# Patient Record
Sex: Male | Born: 1980 | Race: Black or African American | Hispanic: No | Marital: Married | Smoking: Former smoker
Health system: Southern US, Community
[De-identification: ages and names within clinical notes are randomized; demographics above are authoritative.]

## PROBLEM LIST (undated history)

## (undated) DIAGNOSIS — I10 Essential (primary) hypertension: Secondary | ICD-10-CM

## (undated) DIAGNOSIS — M51369 Other intervertebral disc degeneration, lumbar region without mention of lumbar back pain or lower extremity pain: Secondary | ICD-10-CM

## (undated) DIAGNOSIS — E782 Mixed hyperlipidemia: Secondary | ICD-10-CM

## (undated) DIAGNOSIS — M5136 Other intervertebral disc degeneration, lumbar region: Secondary | ICD-10-CM

## (undated) DIAGNOSIS — R569 Unspecified convulsions: Secondary | ICD-10-CM

## (undated) DIAGNOSIS — T7840XA Allergy, unspecified, initial encounter: Secondary | ICD-10-CM

## (undated) DIAGNOSIS — E669 Obesity, unspecified: Secondary | ICD-10-CM

## (undated) DIAGNOSIS — K219 Gastro-esophageal reflux disease without esophagitis: Secondary | ICD-10-CM

## (undated) HISTORY — DX: Other intervertebral disc degeneration, lumbar region: M51.36

## (undated) HISTORY — DX: Unspecified convulsions: R56.9

## (undated) HISTORY — DX: Gastro-esophageal reflux disease without esophagitis: K21.9

## (undated) HISTORY — DX: Allergy, unspecified, initial encounter: T78.40XA

## (undated) HISTORY — PX: TONSILLECTOMY: SUR1361

## (undated) HISTORY — DX: Other intervertebral disc degeneration, lumbar region without mention of lumbar back pain or lower extremity pain: M51.369

---
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
## 2004-04-13 ENCOUNTER — Emergency Department: Payer: Self-pay | Admitting: Emergency Medicine

## 2004-04-27 ENCOUNTER — Emergency Department: Payer: Self-pay | Admitting: Unknown Physician Specialty

## 2004-10-23 ENCOUNTER — Emergency Department: Payer: Self-pay | Admitting: Unknown Physician Specialty

## 2005-03-09 ENCOUNTER — Emergency Department: Payer: Self-pay | Admitting: Emergency Medicine

## 2005-07-24 ENCOUNTER — Emergency Department: Payer: Self-pay | Admitting: Emergency Medicine

## 2006-03-24 ENCOUNTER — Ambulatory Visit: Payer: Self-pay

## 2006-06-21 ENCOUNTER — Inpatient Hospital Stay: Payer: Self-pay | Admitting: Internal Medicine

## 2006-06-21 ENCOUNTER — Other Ambulatory Visit: Payer: Self-pay

## 2007-03-04 DIAGNOSIS — M5136 Other intervertebral disc degeneration, lumbar region: Secondary | ICD-10-CM | POA: Insufficient documentation

## 2007-06-10 DIAGNOSIS — J309 Allergic rhinitis, unspecified: Secondary | ICD-10-CM | POA: Insufficient documentation

## 2007-12-06 ENCOUNTER — Emergency Department: Payer: Self-pay | Admitting: Emergency Medicine

## 2008-04-04 ENCOUNTER — Emergency Department: Payer: Self-pay | Admitting: Emergency Medicine

## 2008-05-26 IMAGING — CR RIGHT ANKLE - COMPLETE 3+ VIEW
1 series · 5 of 5 positions shown · non-contrast
Comparison: none

REASON FOR EXAM: Injury
COMMENTS:

PROCEDURE:     DXR - DXR ANKLE RIGHT COMPLETE  - July 24, 2005 [DATE]
RESULT:     The ankle joint mortise is presented.  The talar dome is intact.
 The mediolateral and posterior malleoli are intact.  I see no calcaneal
fracture.  The observed portions of the hindfoot are normal.

[Series 1: view not recorded · 0.17mm/px · 5 of 5 slices shown]
[im 1/5]
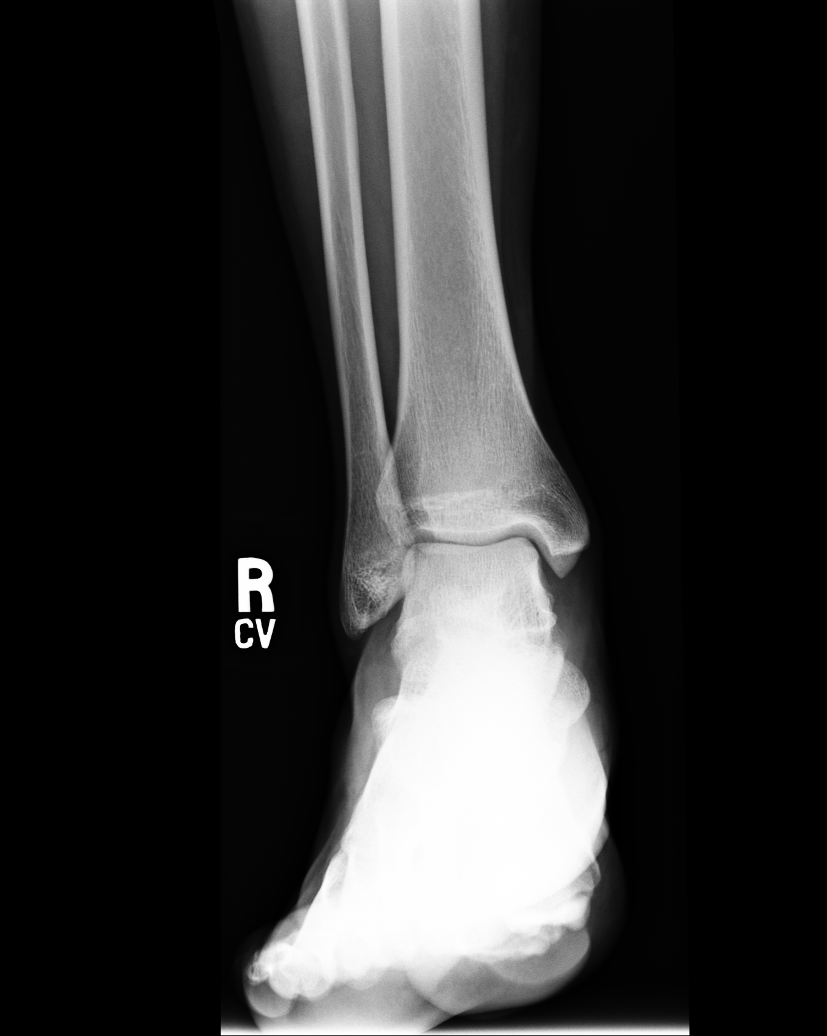
[im 2/5]
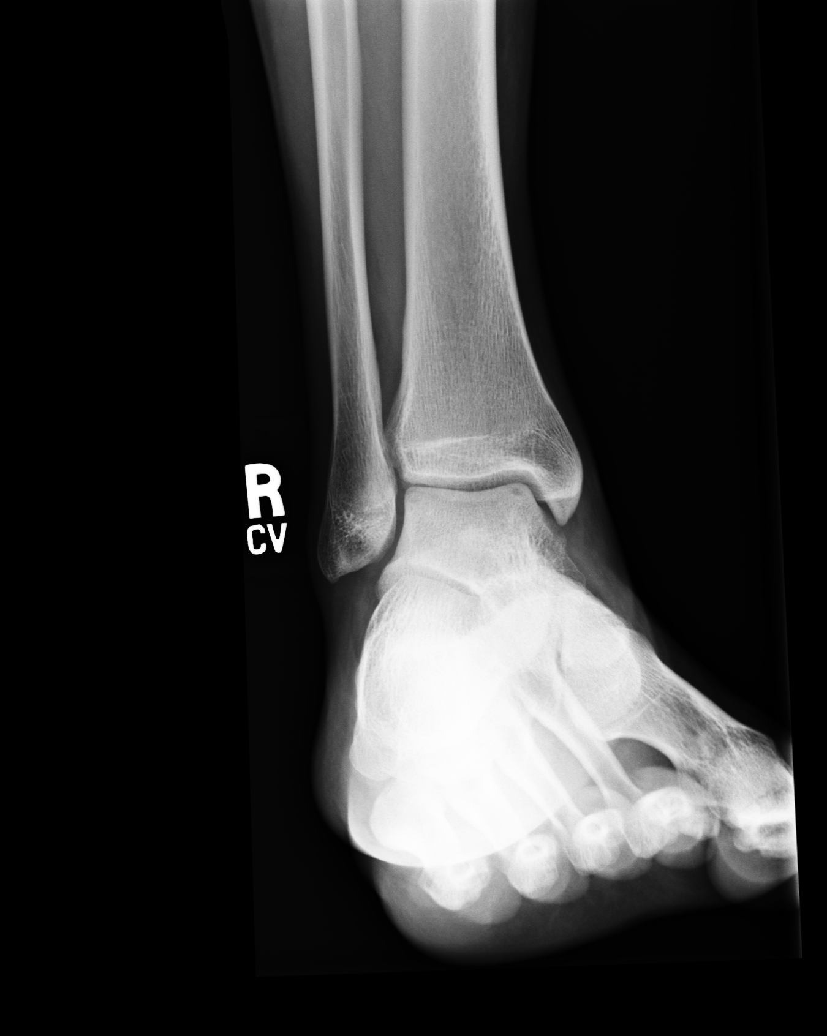
[im 3/5]
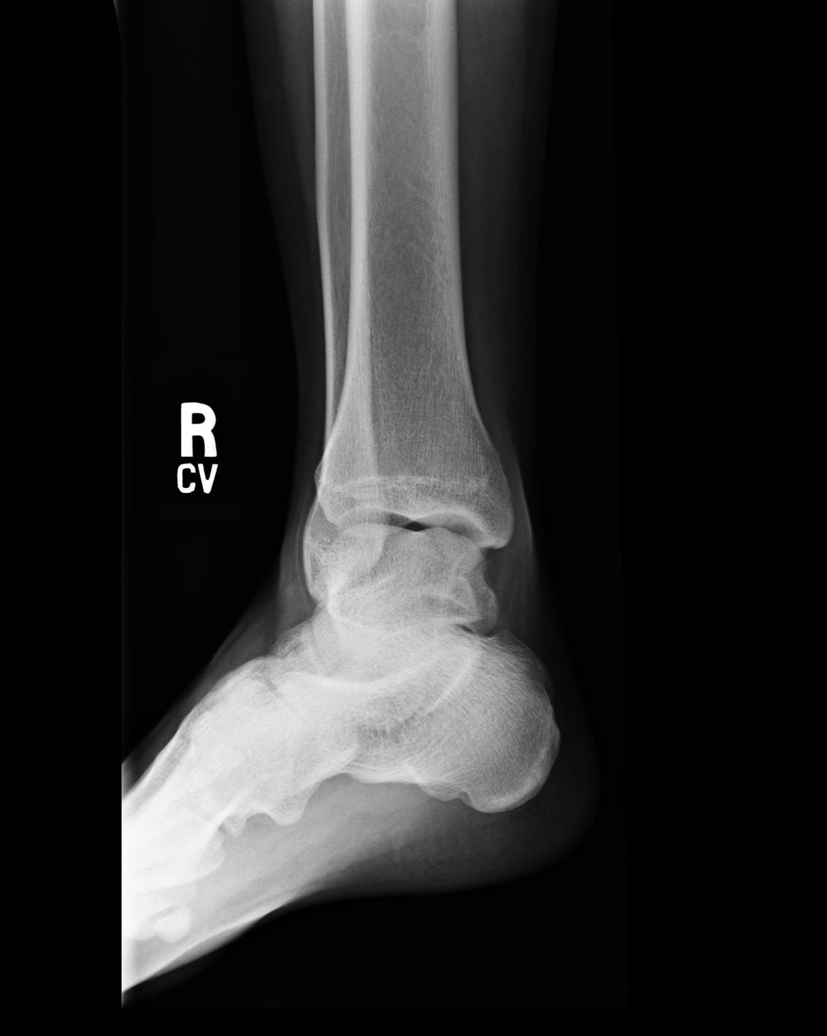
[im 4/5]
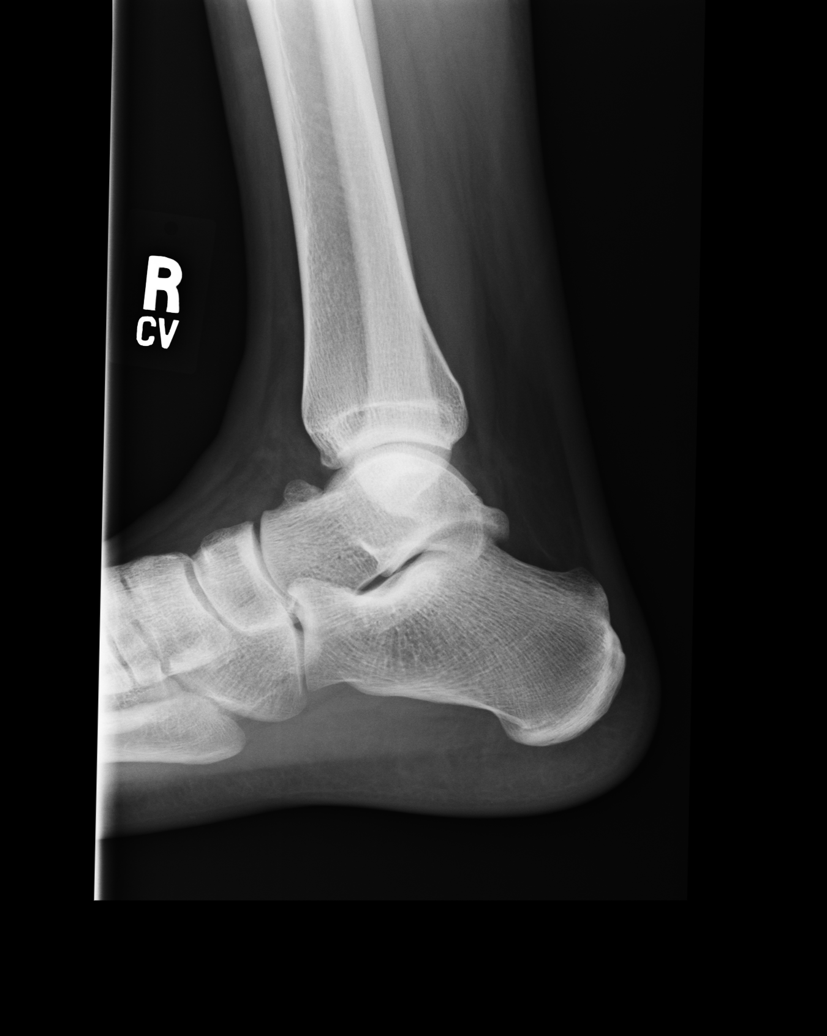
[im 5/5]
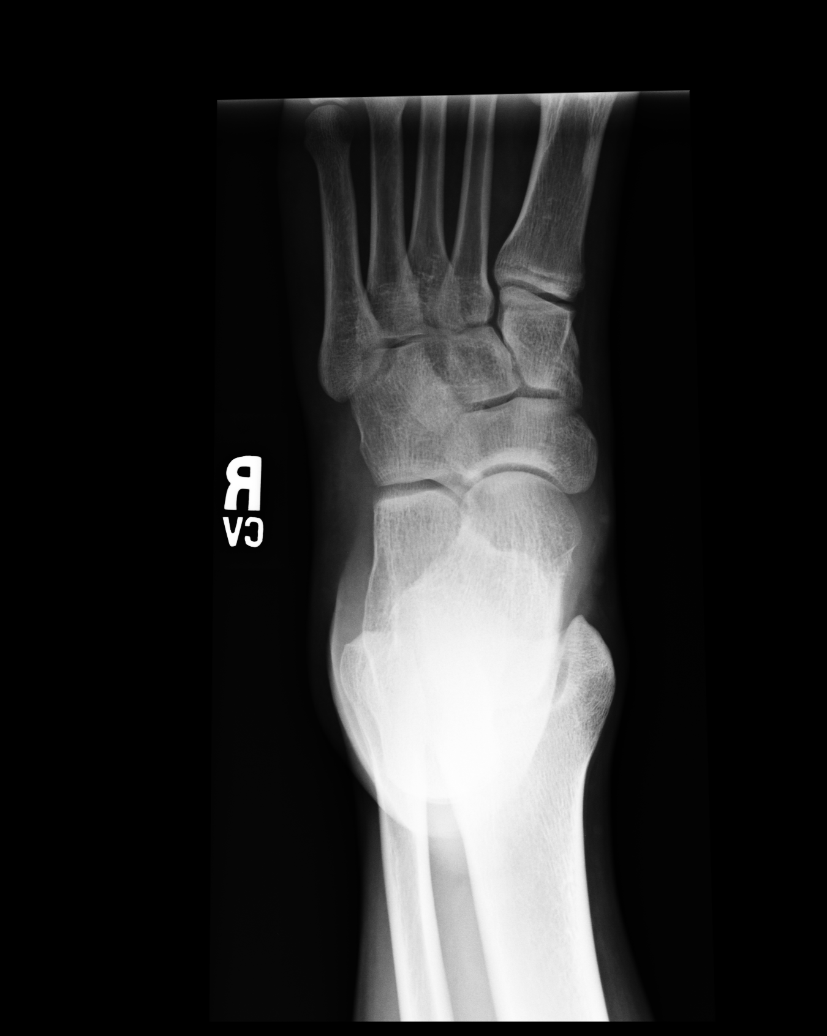

[5 of 5 positions shown; findings below may reference images not displayed]

IMPRESSION: I do not see evidence of acute fracture of the RIGHT ankle.

## 2008-05-26 IMAGING — CR RIGHT FOOT COMPLETE - 3+ VIEW
1 series · 3 of 3 positions shown · non-contrast
Comparison: none

REASON FOR EXAM: Injury
COMMENTS:

PROCEDURE:     DXR - DXR FOOT RT COMPLETE W/OBLIQUES  - July 24, 2005 [DATE]
RESULT:     The bones of the foot are adequately mineralized.  I do not see
evidence of acute fracture.  Specific attention to the tarsometatarsal
junctions reveals no acute abnormality.

[Series 1: view not recorded · 0.17mm/px · 3 of 3 slices shown]
[im 1/3]
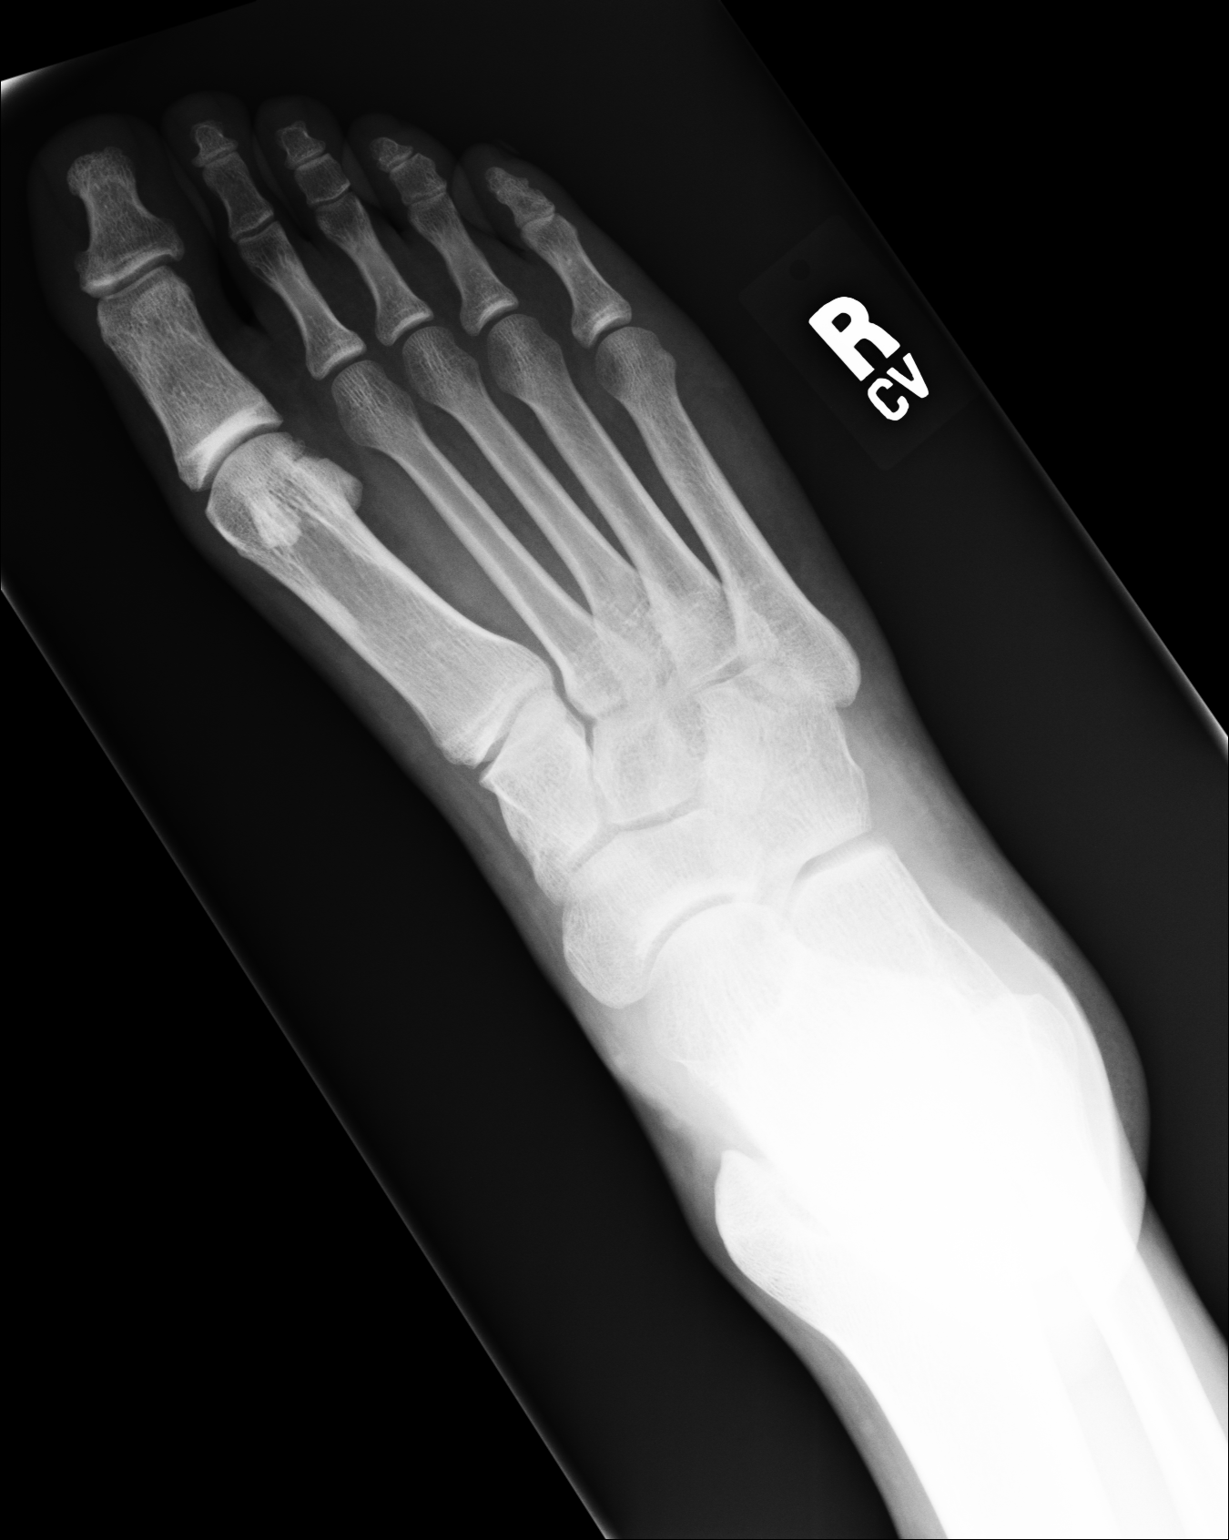
[im 2/3]
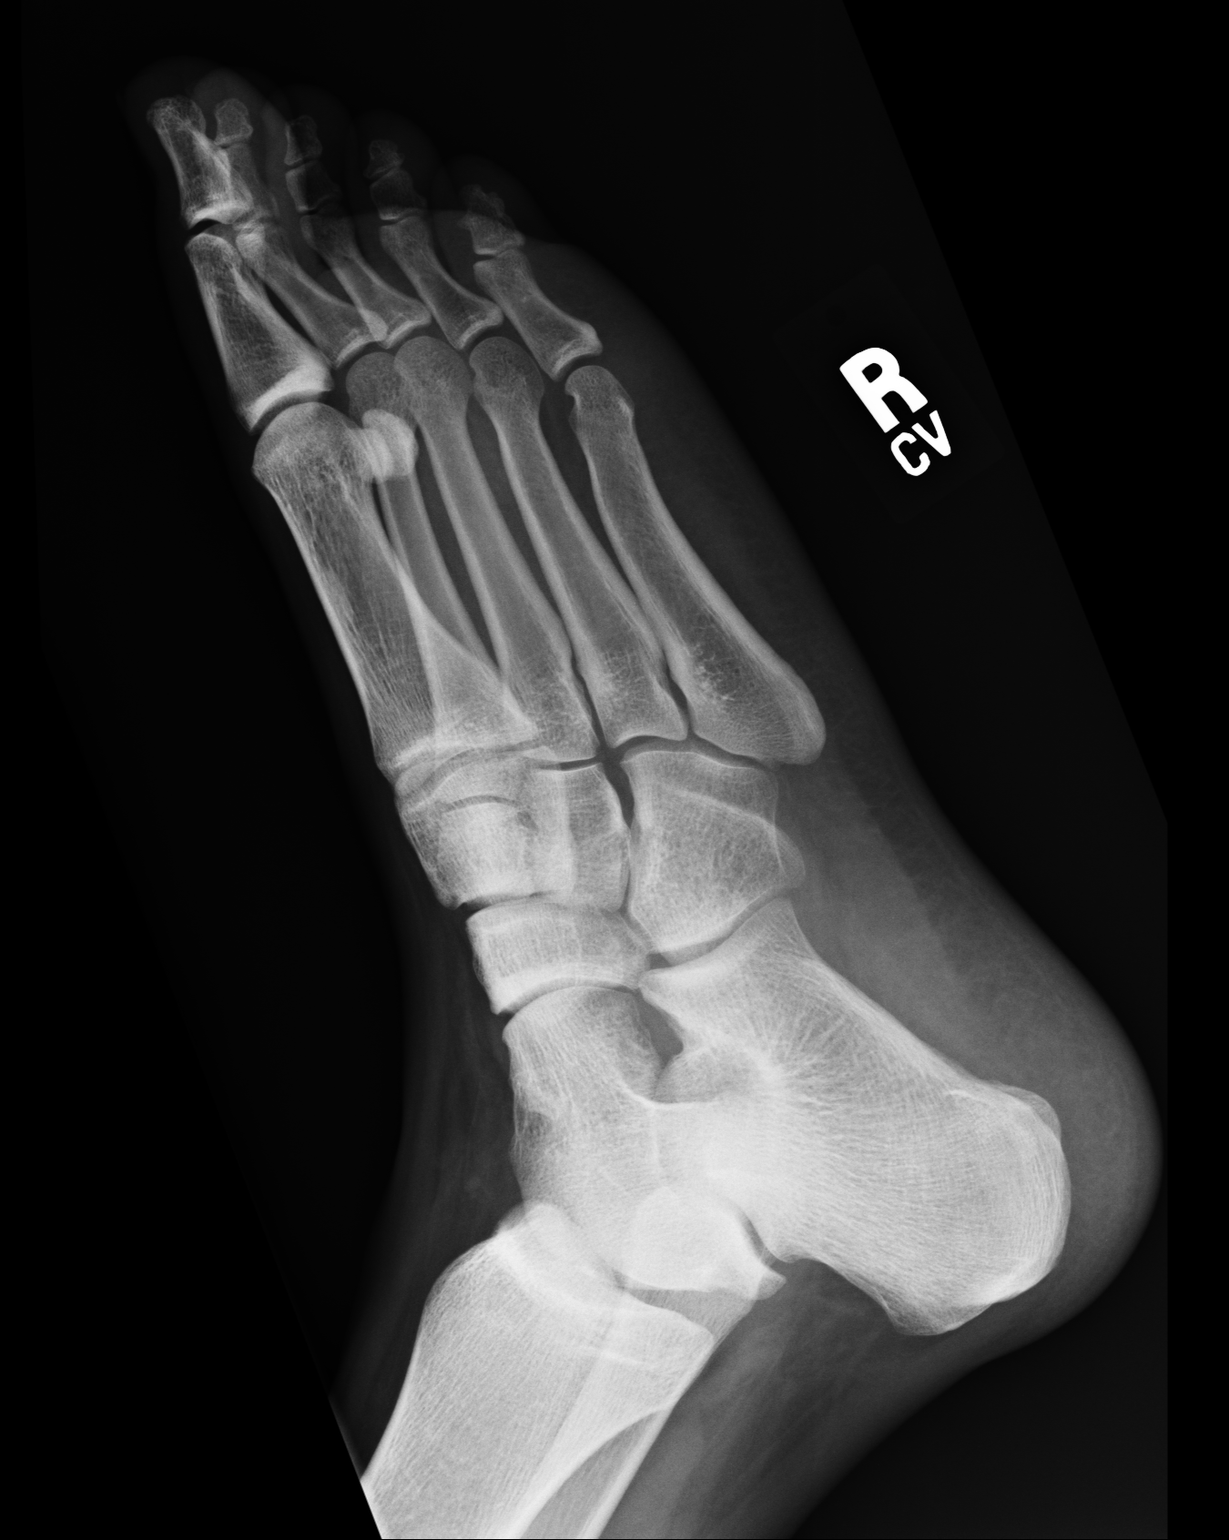
[im 3/3]
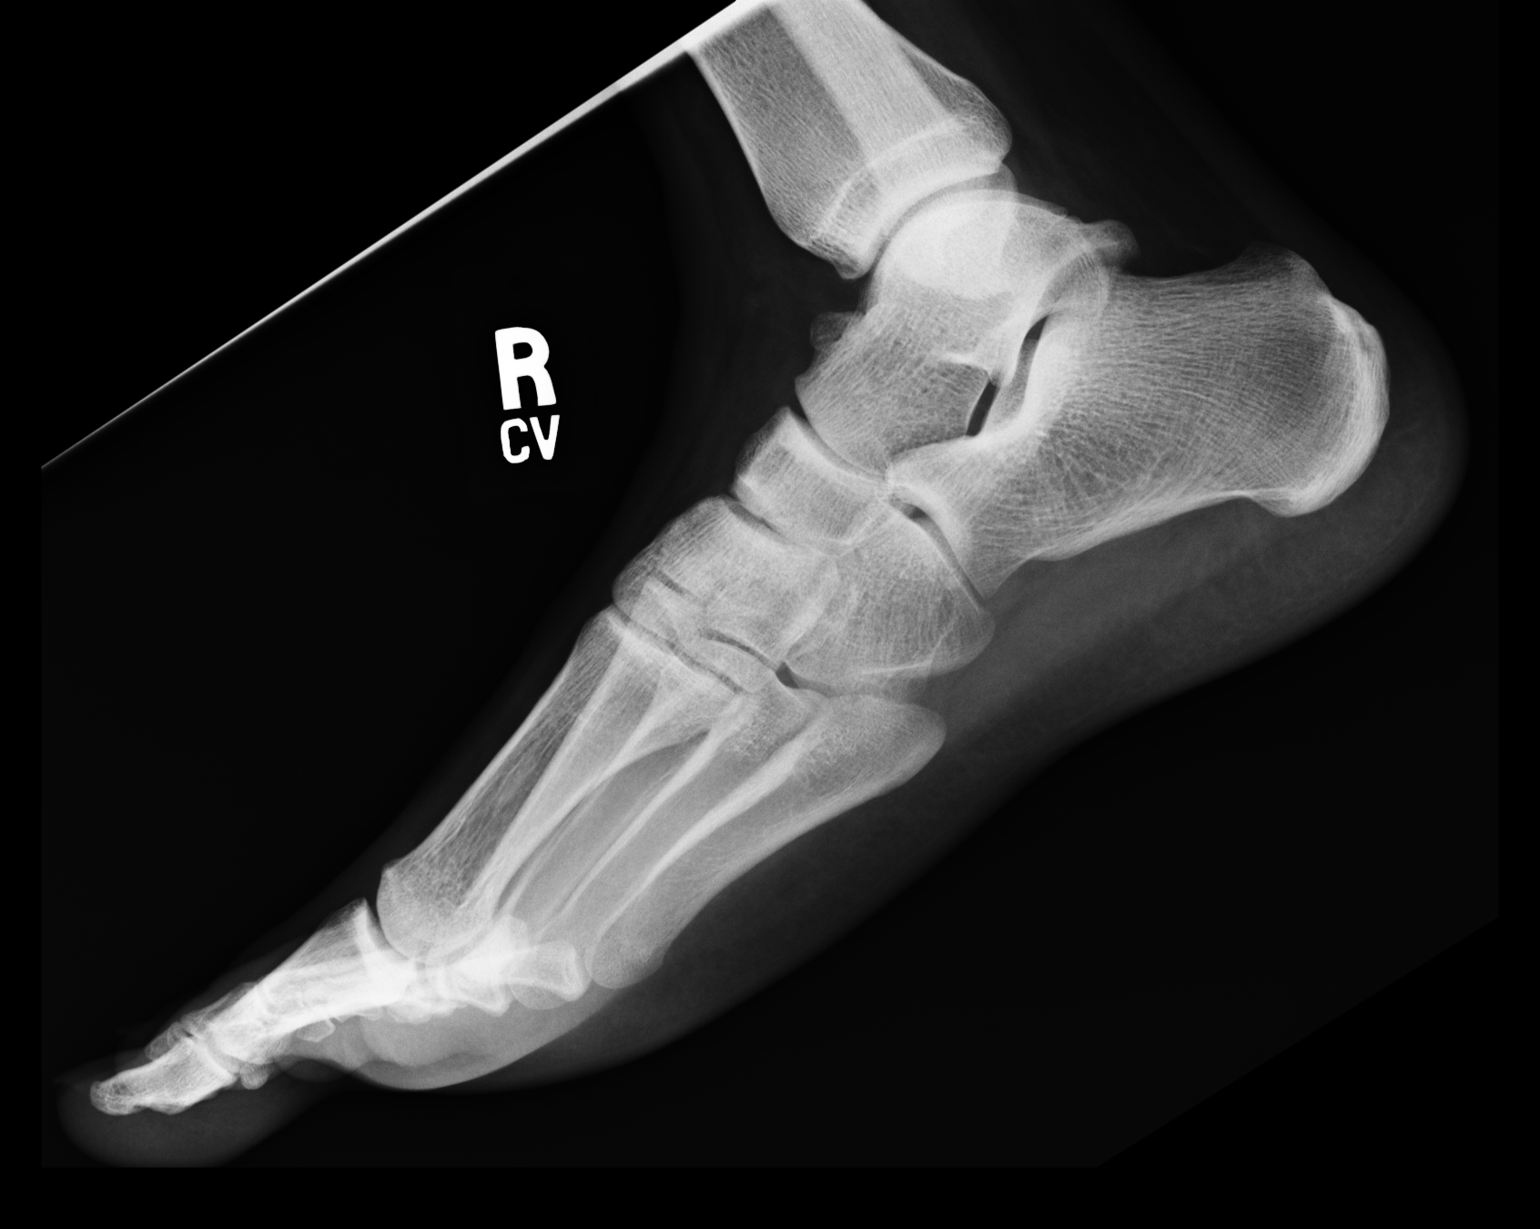

[3 of 3 positions shown; findings below may reference images not displayed]

IMPRESSION: I do not see evidence of acute fracture of the bones of the foot.  Follow-up
films are recommended if the patient's symptoms do not resolve in a fashion
consistent with an uncomplicated sprain.

## 2008-09-25 ENCOUNTER — Ambulatory Visit: Payer: Self-pay | Admitting: Family Medicine

## 2008-11-21 ENCOUNTER — Ambulatory Visit: Payer: Self-pay | Admitting: Family Medicine

## 2010-07-20 ENCOUNTER — Emergency Department: Payer: Self-pay | Admitting: Internal Medicine

## 2010-10-02 ENCOUNTER — Ambulatory Visit: Payer: Self-pay | Admitting: Family Medicine

## 2010-12-31 ENCOUNTER — Ambulatory Visit: Payer: Self-pay | Admitting: Internal Medicine

## 2011-04-25 ENCOUNTER — Emergency Department: Payer: Self-pay | Admitting: Emergency Medicine

## 2011-05-07 ENCOUNTER — Emergency Department: Payer: Self-pay | Admitting: Emergency Medicine

## 2013-02-02 ENCOUNTER — Ambulatory Visit: Payer: Self-pay | Admitting: Family Medicine

## 2014-09-05 ENCOUNTER — Encounter: Payer: Self-pay | Admitting: Family Medicine

## 2014-09-05 ENCOUNTER — Ambulatory Visit (INDEPENDENT_AMBULATORY_CARE_PROVIDER_SITE_OTHER): Payer: BLUE CROSS/BLUE SHIELD | Admitting: Family Medicine

## 2014-09-05 VITALS — BP 128/88 | HR 86 | Temp 98.1°F | Resp 16 | Ht 76.0 in | Wt 305.2 lb

## 2014-09-05 DIAGNOSIS — H1031 Unspecified acute conjunctivitis, right eye: Secondary | ICD-10-CM

## 2014-09-05 MED ORDER — SULFACETAMIDE SODIUM 10 % OP SOLN: 2.0000 [drp] | Freq: Four times a day (QID) | OPHTHALMIC | Status: DC

## 2014-09-05 NOTE — Progress Notes (Signed)
Subjective:     Patient ID: Jordan Donovan, male   DOB: 1980/05/12, 34 y.o.   MRN: [id number]  HPI  Chief Complaint  Patient presents with  . Eye Problem    Patienbt comes in office today with concerns of redness in his right eye for the past 24hrs. Patient states that he believes that he may have a foreign body in his eye. Associated symptoms include itching of the eye, watery eyes and swelling of upper and lower lids. n  States family members are not ill. He noticed his eye starting to bother him after use a weed trimmer on 8/7. Does not remember a foreign body. Has disposed of his contacts. No change in vision.   Review of Systems  Constitutional: Negative for fever and chills.       Objective:   Physical Exam  Constitutional: He appears well-developed and well-nourished. No distress.  Eyes: Pupils are equal, round, and reactive to light. Right eye exhibits discharge (watery). Right conjunctiva is injected.  No f.b.visualized.     Assessment:    1. Conjunctivitis, acute, right eye: viral - sulfacetamide (BLEPH-10) 10 % ophthalmic solution; Place 2 drops into the right eye 4 (four) times daily.  Dispense: 15 mL; Refill: 0    Plan:    Warm compresses frequently. Start eye abx if not improving tomorrow.

## 2014-09-05 NOTE — Patient Instructions (Addendum)
Warm compresses to right eye several x day. May return to work when drainage, redness, and swelling improve. Work excuse provided.

## 2015-10-17 ENCOUNTER — Ambulatory Visit (INDEPENDENT_AMBULATORY_CARE_PROVIDER_SITE_OTHER): Payer: 59 | Admitting: Family Medicine

## 2015-10-17 ENCOUNTER — Encounter: Payer: Self-pay | Admitting: Family Medicine

## 2015-10-17 VITALS — BP 136/100 | HR 86 | Temp 98.3°F | Resp 16 | Wt 303.2 lb

## 2015-10-17 DIAGNOSIS — J029 Acute pharyngitis, unspecified: Secondary | ICD-10-CM

## 2015-10-17 DIAGNOSIS — K219 Gastro-esophageal reflux disease without esophagitis: Secondary | ICD-10-CM | POA: Diagnosis not present

## 2015-10-17 DIAGNOSIS — J45909 Unspecified asthma, uncomplicated: Secondary | ICD-10-CM | POA: Insufficient documentation

## 2015-10-17 NOTE — Patient Instructions (Signed)
Discussed resuming Prilosec otc and elevating the head of his bed 6 inches.

## 2015-10-17 NOTE — Progress Notes (Signed)
Subjective:     Patient ID: Jordan Donovan, male   DOB: [birth year], 35 y.o.   MRN: [id number]  HPI  Chief Complaint  Patient presents with  . Sinus Problem    Patient comes in office today with complaints of sinus/throat dryness and itching/scratchy throat on the right side more so than the left. Patient states that symptoms began on 9/16, he denies taking any otc medication.   Denies URI sx. Reports recurrent GERD: "I sometimes wake up with acid in my mouth"-for which he will take otc Prilosec for two weeks.   Review of Systems     Objective:   Physical Exam  Constitutional: He appears well-developed and well-nourished. No distress.  Abdominal: Soft. There is no tenderness.  Ears: T.M's intact without inflammation Throat: tonsils absent; posterior pharyngeal erythema Neck: no cervical adenopathy Lungs: clear     Assessment:    1. Pharyngitis: suspect mediated by reflux  2. Gastroesophageal reflux disease without esophagitis      Plan:    Discussed HOB elevation and resuming Prilosec otc

## 2016-10-02 ENCOUNTER — Encounter: Payer: Self-pay | Admitting: Family Medicine

## 2016-10-02 ENCOUNTER — Ambulatory Visit (INDEPENDENT_AMBULATORY_CARE_PROVIDER_SITE_OTHER): Payer: BLUE CROSS/BLUE SHIELD | Admitting: Family Medicine

## 2016-10-02 VITALS — BP 138/90 | HR 83 | Temp 98.2°F | Resp 16 | Ht 75.5 in | Wt 310.6 lb

## 2016-10-02 DIAGNOSIS — Z0000 Encounter for general adult medical examination without abnormal findings: Secondary | ICD-10-CM

## 2016-10-02 DIAGNOSIS — Z23 Encounter for immunization: Secondary | ICD-10-CM | POA: Diagnosis not present

## 2016-10-02 LAB — COMPREHENSIVE METABOLIC PANEL
AG Ratio: 1.6 (calc) (ref 1.0–2.5)
ALT: 29 U/L (ref 9–46)
AST: 19 U/L (ref 10–40)
Albumin: 4.5 g/dL (ref 3.6–5.1)
Alkaline phosphatase (APISO): 77 U/L (ref 40–115)
BUN: 19 mg/dL (ref 7–25)
CO2: 28 mmol/L (ref 20–32)
Calcium: 9.6 mg/dL (ref 8.6–10.3)
Chloride: 103 mmol/L (ref 98–110)
Creat: 1.15 mg/dL (ref 0.60–1.35)
Globulin: 2.9 g/dL (calc) (ref 1.9–3.7)
Glucose, Bld: 98 mg/dL (ref 65–99)
Potassium: 4.4 mmol/L (ref 3.5–5.3)
Sodium: 140 mmol/L (ref 135–146)
Total Bilirubin: 0.6 mg/dL (ref 0.2–1.2)
Total Protein: 7.4 g/dL (ref 6.1–8.1)

## 2016-10-02 LAB — LIPID PANEL
Cholesterol: 230 mg/dL — ABNORMAL HIGH (ref <200)
HDL: 61 mg/dL (ref >40)
LDL Cholesterol (Calc): 148 mg/dL (calc) — ABNORMAL HIGH
Non-HDL Cholesterol (Calc): 169 mg/dL (calc) — ABNORMAL HIGH (ref <130)
Total CHOL/HDL Ratio: 3.8 (calc) (ref <5.0)
Triglycerides: 98 mg/dL (ref <150)

## 2016-10-02 NOTE — Patient Instructions (Signed)
We will call you with the lab results. Consider exercise- like walking- 30 minutes daily.

## 2016-10-02 NOTE — Progress Notes (Signed)
Subjective:     Patient ID: Jordan Donovan, male   DOB: 1980/07/24, 36 y.o.   MRN: [id number]  HPI  Chief Complaint  Patient presents with  . Annual Exam    Patient comes in office today for his annual physical he states that he is feeling well today and has no concerns to address but patient did mention that whenever he eats a large portion of pork he has noticed that his hands have been swelling. Patient reports that he has a poor diet and is not actively exercising, patient averages between 7-7.5hrs of sleep a night and reports that his libido is normal.  States he continues to work for a [employer] from 5 AM to 1 PM six days a week. Generally too tired to exercise after work.   Review of Systems General: Feeling well, defers flu vaccine HEENT: regular dental visits and eye exams (contact lenses) Cardiovascular: no chest pain, shortness of breath, or palpitations Respiratory: no longer has asthmatic sx as was GERD mediated. 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 Psychiatric: not depressed Musculoskeletal: no chronic joint pain.    Objective:   Physical Exam  Constitutional: He appears well-developed and well-nourished. No distress.  Eyes: PERRLA Neck: no thyromegaly, tenderness or nodules, no cervical adenopathy ENT: TM's intact without inflammation; tonsils absent Lungs: Clear Heart : RRR without murmur or gallop Abd: bowel sounds present, soft, non-tender, no organomegaly Extremities: no edema Skin: no atypical lesions noted on his back.     Assessment:    1. Need for tetanus booster - Tdap vaccine greater than or equal to 7yo IM  2. Annual physical exam - Comprehensive metabolic panel - Lipid panel    Plan:    Discussed regular exercise and recheck bp. Further f/u pending lab results.

## 2016-10-03 ENCOUNTER — Telehealth: Payer: Self-pay

## 2016-10-03 NOTE — Telephone Encounter (Signed)
-----   Message from Anola Gurneyobert Chauvin, [place] sent at 10/03/2016  7:38 AM EDT ----- Mild cholesterol elevation. Would increase exercise as we discussed and make primarily low fat food choices (especially fried foods).

## 2016-10-03 NOTE — Telephone Encounter (Signed)
Pt informed and voiced understanding of results. 

## 2016-10-03 NOTE — Telephone Encounter (Signed)
[id number] 

## 2016-10-16 ENCOUNTER — Ambulatory Visit (INDEPENDENT_AMBULATORY_CARE_PROVIDER_SITE_OTHER): Payer: BLUE CROSS/BLUE SHIELD | Admitting: Family Medicine

## 2016-10-16 ENCOUNTER — Encounter: Payer: Self-pay | Admitting: Family Medicine

## 2016-10-16 VITALS — BP 140/110 | HR 111 | Temp 98.0°F | Resp 17 | Wt 311.2 lb

## 2016-10-16 DIAGNOSIS — E1159 Type 2 diabetes mellitus with other circulatory complications: Secondary | ICD-10-CM | POA: Insufficient documentation

## 2016-10-16 DIAGNOSIS — I10 Essential (primary) hypertension: Secondary | ICD-10-CM

## 2016-10-16 MED ORDER — HYDROCHLOROTHIAZIDE 25 MG PO TABS: 25.0000 mg | ORAL_TABLET | Freq: Every day | ORAL | 0 refills | Status: DC

## 2016-10-16 NOTE — Progress Notes (Signed)
Subjective:     Patient ID: Jordan Donovan, male   DOB: Dec 18, 1980, 36 y.o.   MRN: [id number]  HPI  Chief Complaint  Patient presents with  . Hypertension    Patient returns to office today for elevated blood pressure, last office visit was 10/02/16.   States maternal side of his family have hypertension. Wishes to start medication today. Accompanied by his son today.   Review of Systems     Objective:   Physical Exam  Constitutional: He appears well-developed and well-nourished. No distress.  Cardiovascular: Normal rate and regular rhythm.   Pulmonary/Chest: Breath sounds normal.  Musculoskeletal: He exhibits no edema (of lower extremities).       Assessment:    1. Essential hypertension - hydrochlorothiazide (HYDRODIURIL) 25 MG tablet; Take 1 tablet (25 mg total) by mouth daily.  Dispense: 30 tablet; Refill: 0    Plan:    F/u in 3 weeks.

## 2016-10-16 NOTE — Patient Instructions (Signed)
Let me know if you can't tolerate the medication. 

## 2016-11-06 ENCOUNTER — Ambulatory Visit: Payer: Self-pay | Admitting: Family Medicine

## 2016-11-07 ENCOUNTER — Ambulatory Visit (INDEPENDENT_AMBULATORY_CARE_PROVIDER_SITE_OTHER): Payer: BLUE CROSS/BLUE SHIELD | Admitting: Family Medicine

## 2016-11-07 ENCOUNTER — Encounter: Payer: Self-pay | Admitting: Family Medicine

## 2016-11-07 VITALS — BP 144/108 | HR 107 | Temp 98.3°F | Resp 16 | Wt 307.2 lb

## 2016-11-07 DIAGNOSIS — I10 Essential (primary) hypertension: Secondary | ICD-10-CM | POA: Diagnosis not present

## 2016-11-07 MED ORDER — AMLODIPINE BESYLATE 10 MG PO TABS: 10.0000 mg | ORAL_TABLET | Freq: Every day | ORAL | 0 refills | Status: DC

## 2016-11-07 MED ORDER — HYDROCHLOROTHIAZIDE 25 MG PO TABS: 25.0000 mg | ORAL_TABLET | Freq: Every day | ORAL | 0 refills | Status: DC

## 2016-11-07 NOTE — Patient Instructions (Signed)
Continue both medications. Let me know if you can't tolerate.

## 2016-11-07 NOTE — Progress Notes (Signed)
Subjective:     Patient ID: Jordan Donovan, male   DOB: [birth year], 36 y.o.   MRN: [id number]  HPI  Chief Complaint  Patient presents with  . Hypertension    Patient comes in office today for 3 week follow up, patient was last seen on 10/16/16 and started on HCTZ  qd. Blood pressure at last visit was 140/110, he states that he has had good compliance and tolerance on medication.   Reports one day of cramps which resolved. Denies routine nsaid use or added salt. He is making an attempt to eat low salt foods.   Review of Systems     Objective:   Physical Exam  Constitutional: He appears well-developed and well-nourished. No distress.  Cardiovascular: Normal rate and regular rhythm.   Pulmonary/Chest: Breath sounds normal.  Musculoskeletal: He exhibits no edema (of lower extremities).       Assessment:    1. Essential hypertension: add medication for improved control - amLODipine (NORVASC) 10 MG tablet; Take 1 tablet (10 mg total) by mouth daily.  Dispense: 30 tablet; Refill: 0 - hydrochlorothiazide (HYDRODIURIL) 25 MG tablet; Take 1 tablet (25 mg total) by mouth daily.  Dispense: 30 tablet; Refill: 0    Plan:    Will return in 3 weeks.

## 2016-11-28 ENCOUNTER — Ambulatory Visit (INDEPENDENT_AMBULATORY_CARE_PROVIDER_SITE_OTHER): Payer: BLUE CROSS/BLUE SHIELD | Admitting: Family Medicine

## 2016-11-28 ENCOUNTER — Encounter: Payer: Self-pay | Admitting: Family Medicine

## 2016-11-28 VITALS — BP 126/96 | HR 106 | Temp 98.2°F | Resp 16 | Wt 310.0 lb

## 2016-11-28 DIAGNOSIS — I10 Essential (primary) hypertension: Secondary | ICD-10-CM | POA: Diagnosis not present

## 2016-11-28 MED ORDER — CHLORTHALIDONE 25 MG PO TABS: 25.0000 mg | ORAL_TABLET | Freq: Every day | ORAL | 0 refills | Status: DC

## 2016-11-28 MED ORDER — AMLODIPINE BESYLATE 10 MG PO TABS: 10.0000 mg | ORAL_TABLET | Freq: Every day | ORAL | 1 refills | Status: DC

## 2016-11-28 NOTE — Patient Instructions (Signed)
Stop the HCTZ (hydrochlorothiazide) and take the new medication along with the amlodipine.

## 2016-11-28 NOTE — Progress Notes (Signed)
Subjective:     Patient ID: Jordan Donovan, male   DOB: 1980/04/24, 36 y.o.   MRN: [id number]  HPI  Chief Complaint  Patient presents with  . Hypertension    Patient comes in office today for follow up from 11/07/16, at last visit patient was started on Amlodpine 10mg  qd along with HCTZ 25mg  qd. Patients blood pressure at last visit was 144/108, he reports good compliance and tolerance on medication.      Review of Systems  Respiratory: Negative for shortness of breath.   Cardiovascular: Negative for chest pain and palpitations.       Objective:   Physical Exam  Constitutional: He appears well-developed and well-nourished. No distress.  Cardiovascular: Normal rate and regular rhythm.   Pulmonary/Chest: Breath sounds normal.  Musculoskeletal: He exhibits no edema (of lower extremtties).       Assessment:    1. Essential hypertension:adjust medication for better control. - chlorthalidone (HYGROTON) 25 MG tablet; Take 1 tablet (25 mg total) by mouth daily.  Dispense: 30 tablet; Refill: 0 - amLODipine (NORVASC) 10 MG tablet; Take 1 tablet (10 mg total) by mouth daily.  Dispense: 90 tablet; Refill: 1    Plan:    F/u in 3 weeks. Stop HCTZ.

## 2016-12-19 ENCOUNTER — Encounter: Payer: Self-pay | Admitting: Family Medicine

## 2016-12-19 ENCOUNTER — Ambulatory Visit: Payer: BLUE CROSS/BLUE SHIELD | Admitting: Family Medicine

## 2016-12-19 VITALS — BP 136/86 | HR 111 | Temp 98.3°F | Resp 16 | Wt 305.0 lb

## 2016-12-19 DIAGNOSIS — I10 Essential (primary) hypertension: Secondary | ICD-10-CM

## 2016-12-19 MED ORDER — CHLORTHALIDONE 25 MG PO TABS: 25.0000 mg | ORAL_TABLET | Freq: Every day | ORAL | 1 refills | Status: DC

## 2016-12-19 NOTE — Progress Notes (Signed)
Subjective:     Patient ID: Jordan Donovan, male   DOB: 1980-04-01, 36 y.o.   MRN: [id number] Chief Complaint  Patient presents with  . Hypertension  Reports compliance and tolerance of medication. HPI   Review of Systems  Respiratory: Negative for shortness of breath.   Cardiovascular: Negative for chest pain and palpitations.       Objective:   Physical Exam  Constitutional: He appears well-developed and well-nourished. No distress.  Cardiovascular: Normal rate and regular rhythm.  Pulmonary/Chest: Breath sounds normal.  Musculoskeletal: He exhibits no edema (of lower extremities).       Assessment:    1. Essential hypertension: improved-continue amlodipine - chlorthalidone (HYGROTON) 25 MG tablet; Take 1 tablet (25 mg total) by mouth daily.  Dispense: 90 tablet; Refill: 1    Plan:    Encouraged 5-10% weight loss.

## 2016-12-19 NOTE — Patient Instructions (Signed)
Work on 5-10% weight loss.

## 2017-06-05 ENCOUNTER — Other Ambulatory Visit: Payer: Self-pay | Admitting: Family Medicine

## 2017-06-05 DIAGNOSIS — I10 Essential (primary) hypertension: Secondary | ICD-10-CM

## 2017-06-18 ENCOUNTER — Ambulatory Visit: Payer: Self-pay | Admitting: Family Medicine

## 2017-06-18 ENCOUNTER — Encounter: Payer: Self-pay | Admitting: Family Medicine

## 2017-06-18 ENCOUNTER — Ambulatory Visit: Payer: BLUE CROSS/BLUE SHIELD | Admitting: Family Medicine

## 2017-06-18 VITALS — BP 116/82 | HR 106 | Temp 98.4°F | Resp 16 | Ht 76.0 in | Wt 306.0 lb

## 2017-06-18 DIAGNOSIS — I10 Essential (primary) hypertension: Secondary | ICD-10-CM

## 2017-06-18 NOTE — Patient Instructions (Signed)
Try to start a walking/ exercise program 30 minutes daily.

## 2017-06-18 NOTE — Progress Notes (Signed)
  Subjective:     Patient ID: Jordan Donovan, male   DOB: 1980/04/02, 37 y.o.   MRN: [id number] Chief Complaint  Patient presents with  . Hypertension   HPI States his schedule prevents him from exercising regularly.  Review of Systems  Respiratory: Negative for shortness of breath.   Cardiovascular: Negative for chest pain and palpitations.       Objective:   Physical Exam  Constitutional: He appears well-developed and well-nourished. No distress.  Cardiovascular: Normal rate and regular rhythm.  Pulmonary/Chest: Breath sounds normal.  Musculoskeletal: He exhibits no edema (of lower extremities).       Assessment:    1. Essential hypertension: controlled     Plan:    Will update labs at next office visit.

## 2017-09-24 ENCOUNTER — Other Ambulatory Visit: Payer: Self-pay | Admitting: Family Medicine

## 2017-09-24 DIAGNOSIS — I10 Essential (primary) hypertension: Secondary | ICD-10-CM

## 2017-09-24 NOTE — Telephone Encounter (Signed)
Please review. KW 

## 2017-10-22 ENCOUNTER — Ambulatory Visit: Payer: Self-pay | Admitting: Family Medicine

## 2017-10-29 ENCOUNTER — Encounter: Payer: Self-pay | Admitting: Family Medicine

## 2017-10-29 ENCOUNTER — Ambulatory Visit: Payer: Self-pay | Admitting: Family Medicine

## 2017-10-29 ENCOUNTER — Ambulatory Visit: Payer: BLUE CROSS/BLUE SHIELD | Admitting: Family Medicine

## 2017-10-29 VITALS — BP 122/78 | HR 94 | Temp 98.3°F | Resp 16 | Wt 305.0 lb

## 2017-10-29 DIAGNOSIS — I10 Essential (primary) hypertension: Secondary | ICD-10-CM

## 2017-10-29 DIAGNOSIS — Z13220 Encounter for screening for lipoid disorders: Secondary | ICD-10-CM | POA: Diagnosis not present

## 2017-10-29 NOTE — Progress Notes (Addendum)
  Subjective:     Patient ID: Jordan Donovan, male   DOB: Aug 13, 1980, 37 y.o.   MRN: [id number] Chief Complaint  Patient presents with  . Hypertension    Patient comes in today for a f/u of bp. Patient states he is not able to check bp at home and went to his eye doctor's appt 2 days ago and bp was 126/82. Patient is not experiencing any syptoms.   HPI States he has started to exercise again.Defers flu vaccine.  Review of Systems  Respiratory: Negative for shortness of breath.   Cardiovascular: Negative for chest pain and palpitations.       Objective:   Physical Exam  Constitutional: He appears well-developed and well-nourished. No distress.  Cardiovascular: Normal rate and regular rhythm.  Pulmonary/Chest: Breath sounds normal.  Musculoskeletal: He exhibits no edema (distal lower extremities).       Assessment:    1. Essential hypertension: stable - Comprehensive metabolic panel  2. Screening for cholesterol level - Lipid panel    Plan:    Further f/u pending lab results.

## 2017-10-29 NOTE — Patient Instructions (Signed)
We will cal you with the lab results. Continue regular exercise 30 minutes daily.

## 2017-10-30 LAB — COMPREHENSIVE METABOLIC PANEL
ALT: 25 IU/L (ref 0–44)
AST: 18 IU/L (ref 0–40)
Albumin/Globulin Ratio: 1.8 (ref 1.2–2.2)
Albumin: 4.8 g/dL (ref 3.5–5.5)
Alkaline Phosphatase: 87 IU/L (ref 39–117)
BUN/Creatinine Ratio: 17 (ref 9–20)
BUN: 19 mg/dL (ref 6–20)
Bilirubin Total: 0.4 mg/dL (ref 0.0–1.2)
CO2: 25 mmol/L (ref 20–29)
Calcium: 10.2 mg/dL (ref 8.7–10.2)
Chloride: 98 mmol/L (ref 96–106)
Creatinine, Ser: 1.15 mg/dL (ref 0.76–1.27)
GFR calc Af Amer: 93 mL/min/{1.73_m2} (ref >59)
GFR calc non Af Amer: 80 mL/min/{1.73_m2} (ref >59)
Globulin, Total: 2.7 g/dL (ref 1.5–4.5)
Glucose: 109 mg/dL — ABNORMAL HIGH (ref 65–99)
Potassium: 3.7 mmol/L (ref 3.5–5.2)
Sodium: 143 mmol/L (ref 134–144)
Total Protein: 7.5 g/dL (ref 6.0–8.5)

## 2017-10-30 LAB — LIPID PANEL
Chol/HDL Ratio: 3.9 ratio (ref 0.0–5.0)
Cholesterol, Total: 224 mg/dL — ABNORMAL HIGH (ref 100–199)
HDL: 57 mg/dL (ref >39)
LDL Calculated: 151 mg/dL — ABNORMAL HIGH (ref 0–99)
Triglycerides: 79 mg/dL (ref 0–149)
VLDL Cholesterol Cal: 16 mg/dL (ref 5–40)

## 2018-01-31 ENCOUNTER — Emergency Department (HOSPITAL_COMMUNITY)
Admission: EM | Admit: 2018-01-31 | Discharge: 2018-02-01 | Disposition: A | Discharge status: Home / Self Care | Payer: BLUE CROSS/BLUE SHIELD | Attending: Emergency Medicine | Admitting: Emergency Medicine

## 2018-01-31 ENCOUNTER — Encounter (HOSPITAL_COMMUNITY): Payer: Self-pay | Admitting: Emergency Medicine

## 2018-01-31 ENCOUNTER — Emergency Department (HOSPITAL_COMMUNITY): Payer: BLUE CROSS/BLUE SHIELD

## 2018-01-31 DIAGNOSIS — R509 Fever, unspecified: Secondary | ICD-10-CM

## 2018-01-31 DIAGNOSIS — J181 Lobar pneumonia, unspecified organism: Secondary | ICD-10-CM | POA: Diagnosis not present

## 2018-01-31 DIAGNOSIS — R05 Cough: Secondary | ICD-10-CM | POA: Diagnosis not present

## 2018-01-31 DIAGNOSIS — J189 Pneumonia, unspecified organism: Secondary | ICD-10-CM | POA: Diagnosis not present

## 2018-01-31 DIAGNOSIS — I10 Essential (primary) hypertension: Secondary | ICD-10-CM | POA: Diagnosis not present

## 2018-01-31 DIAGNOSIS — E876 Hypokalemia: Secondary | ICD-10-CM

## 2018-01-31 DIAGNOSIS — R059 Cough, unspecified: Secondary | ICD-10-CM

## 2018-01-31 DIAGNOSIS — Z87891 Personal history of nicotine dependence: Secondary | ICD-10-CM | POA: Insufficient documentation

## 2018-01-31 DIAGNOSIS — R000 Tachycardia, unspecified: Secondary | ICD-10-CM | POA: Diagnosis not present

## 2018-01-31 DIAGNOSIS — Z79899 Other long term (current) drug therapy: Secondary | ICD-10-CM | POA: Diagnosis not present

## 2018-01-31 LAB — CBC WITH DIFFERENTIAL/PLATELET
Abs Immature Granulocytes: 0.07 K/uL (ref 0.00–0.07)
Basophils Absolute: 0.1 K/uL (ref 0.0–0.1)
Basophils Relative: 1 %
Eosinophils Absolute: 0 K/uL (ref 0.0–0.5)
Eosinophils Relative: 0 %
HCT: 44.7 % (ref 39.0–52.0)
Hemoglobin: 14.5 g/dL (ref 13.0–17.0)
Immature Granulocytes: 1 %
Lymphocytes Relative: 9 %
Lymphs Abs: 1.3 K/uL (ref 0.7–4.0)
MCH: 27.3 pg (ref 26.0–34.0)
MCHC: 32.4 g/dL (ref 30.0–36.0)
MCV: 84 fL (ref 80.0–100.0)
Monocytes Absolute: 1.8 K/uL — ABNORMAL HIGH (ref 0.1–1.0)
Monocytes Relative: 13 %
Neutro Abs: 10.7 K/uL — ABNORMAL HIGH (ref 1.7–7.7)
Neutrophils Relative %: 76 %
Platelets: 362 K/uL (ref 150–400)
RBC: 5.32 MIL/uL (ref 4.22–5.81)
RDW: 13.8 % (ref 11.5–15.5)
WBC: 14 K/uL — ABNORMAL HIGH (ref 4.0–10.5)
nRBC: 0 % (ref 0.0–0.2)

## 2018-01-31 LAB — BASIC METABOLIC PANEL
Anion gap: 11 (ref 5–15)
BUN: 13 mg/dL (ref 6–20)
CO2: 27 mmol/L (ref 22–32)
Calcium: 8.7 mg/dL — ABNORMAL LOW (ref 8.9–10.3)
Chloride: 95 mmol/L — ABNORMAL LOW (ref 98–111)
Creatinine, Ser: 1.14 mg/dL (ref 0.61–1.24)
GFR calc Af Amer: >60 mL/min (ref >60)
GFR calc non Af Amer: >60 mL/min (ref >60)
Glucose, Bld: 108 mg/dL — ABNORMAL HIGH (ref 70–99)
Potassium: 3.5 mmol/L (ref 3.5–5.1)
Sodium: 133 mmol/L — ABNORMAL LOW (ref 135–145)

## 2018-01-31 LAB — I-STAT CG4 LACTIC ACID, ED: Lactic Acid, Venous: 0.7 mmol/L (ref 0.5–1.9)

## 2018-01-31 LAB — COMPREHENSIVE METABOLIC PANEL WITH GFR
ALT: 31 U/L (ref 0–44)
AST: 27 U/L (ref 15–41)
Albumin: 3.8 g/dL (ref 3.5–5.0)
Alkaline Phosphatase: 65 U/L (ref 38–126)
Anion gap: 13 (ref 5–15)
BUN: 13 mg/dL (ref 6–20)
CO2: 29 mmol/L (ref 22–32)
Calcium: 9.4 mg/dL (ref 8.9–10.3)
Chloride: 92 mmol/L — ABNORMAL LOW (ref 98–111)
Creatinine, Ser: 1.24 mg/dL (ref 0.61–1.24)
GFR calc Af Amer: >60 mL/min
GFR calc non Af Amer: >60 mL/min
Glucose, Bld: 115 mg/dL — ABNORMAL HIGH (ref 70–99)
Potassium: 2.6 mmol/L — CL (ref 3.5–5.1)
Sodium: 134 mmol/L — ABNORMAL LOW (ref 135–145)
Total Bilirubin: 0.9 mg/dL (ref 0.3–1.2)
Total Protein: 8.1 g/dL (ref 6.5–8.1)

## 2018-01-31 LAB — INFLUENZA PANEL BY PCR (TYPE A & B)
Influenza A By PCR: NEGATIVE
Influenza B By PCR: NEGATIVE

## 2018-01-31 LAB — GROUP A STREP BY PCR: Group A Strep by PCR: NOT DETECTED

## 2018-01-31 MED ORDER — IBUPROFEN 800 MG PO TABS
800.0000 mg | ORAL_TABLET | Freq: Once | ORAL | Status: AC
  Administered 2018-01-31: 800 mg via ORAL
  Filled 2018-01-31: qty 1

## 2018-01-31 MED ORDER — IPRATROPIUM-ALBUTEROL 0.5-2.5 (3) MG/3ML IN SOLN
3.0000 mL | Freq: Once | RESPIRATORY_TRACT | Status: AC
  Administered 2018-01-31: 3 mL via RESPIRATORY_TRACT
  Filled 2018-01-31: qty 3

## 2018-01-31 MED ORDER — AZITHROMYCIN 250 MG PO TABS
500.0000 mg | ORAL_TABLET | Freq: Once | ORAL | Status: AC
  Administered 2018-01-31: 500 mg via ORAL
  Filled 2018-01-31: qty 2

## 2018-01-31 MED ORDER — SODIUM CHLORIDE 0.9 % IV BOLUS
1000.0000 mL | Freq: Once | INTRAVENOUS | Status: AC
Start: 2018-01-31 — End: 2018-01-31
  Administered 2018-01-31: 1000 mL via INTRAVENOUS

## 2018-01-31 MED ORDER — ONDANSETRON HCL 4 MG/2ML IJ SOLN
4.0000 mg | Freq: Once | INTRAMUSCULAR | Status: AC
  Administered 2018-01-31: 4 mg via INTRAVENOUS
  Filled 2018-01-31: qty 2

## 2018-01-31 MED ORDER — ACETAMINOPHEN 500 MG PO TABS
1000.0000 mg | ORAL_TABLET | Freq: Once | ORAL | Status: AC
  Administered 2018-01-31: 1000 mg via ORAL
  Filled 2018-01-31: qty 2

## 2018-01-31 MED ORDER — POTASSIUM CHLORIDE CRYS ER 20 MEQ PO TBCR
60.0000 meq | EXTENDED_RELEASE_TABLET | Freq: Once | ORAL | Status: AC
  Administered 2018-01-31: 60 meq via ORAL
  Filled 2018-01-31: qty 3

## 2018-01-31 MED ORDER — SODIUM CHLORIDE 0.9 % IV SOLN
1.0000 g | Freq: Once | INTRAVENOUS | Status: AC
  Administered 2018-01-31: 1 g via INTRAVENOUS
  Filled 2018-01-31: qty 10

## 2018-01-31 MED ORDER — POTASSIUM CHLORIDE 10 MEQ/100ML IV SOLN
10.0000 meq | Freq: Once | INTRAVENOUS | Status: AC
  Administered 2018-01-31: 10 meq via INTRAVENOUS
  Filled 2018-01-31: qty 100

## 2018-01-31 NOTE — ED Notes (Signed)
Ambulated pt approximately 21ft while monitoring pulse oximetry. SPO2 maintained 91%-94% while ambulating. Pt tolerated well.

## 2018-01-31 NOTE — ED Triage Notes (Signed)
Patient reports that he is here due to feeling nausea, he reports throwing up one time yesterday-he felt better after throwing up; when he ate this morning, he reports feeling nausea again but did not throw.  He adds that his daughter was diagnosed with respiratory infection last week.  EDP at bedside

## 2018-01-31 NOTE — ED Notes (Signed)
Called micro, influenza panel A&B are negative

## 2018-01-31 NOTE — ED Provider Notes (Signed)
Medical screening examination/treatment/procedure(s) were conducted as a shared visit with non-physician practitioner(s) and myself.  I personally evaluated the patient during the encounter.  EKG Interpretation  Date/Time:  Sunday January 31 2018 18:53:45 EST Ventricular Rate:  124 PR Interval:    QRS Duration: 92 QT Interval:  324 QTC Calculation: 466 R Axis:   50 Text Interpretation:  Sinus tachycardia Low voltage, precordial leads no acute ischemia, no change from previous Confirmed by Arby Barrette [phone]) on 01/31/2018 9:12:16 PM Patient symptoms have started with cough.  He reports he developed what felt like a chest cold and then started getting chest pain with coughing.  He has gotten very fatigued today and felt short of breath with exertion.  Patient is alert.  He has mild tachypnea at rest.  He has intermittent harsh cough.  Heart regular.  Lungs grossly clear with occasional fine wheeze.  Abdomen soft and nontender.  Lower extremities without peripheral edema or calf tenderness.  Skin warm and dry.  Patient had been hypokalemic.  He has been supplemented and repeat potassium has normalized.  Patient continues to have chest pain with cough.  He feels shortness of breath with exertion.  Will administer albuterol and obtain a lactic acid.  Patient has been rehydrated.  He is still due for Rocephin.  Will reassess to determine if stable for discharge.  Lactic acid normal.  Potassium is stabilized.  Vital signs are stable.  At this time, patient is appropriate for discharge.  Return precautions reviewed.   Arby Barrette, MD 02/02/18 [phone]

## 2018-01-31 NOTE — ED Provider Notes (Signed)
[facility] EMERGENCY DEPARTMENT Provider Note   CSN: [id number] Arrival date & time: 01/31/18  1828     History   Chief Complaint No chief complaint on file.   HPI Jordan Donovan is a 38 y.o. male.  The history is provided by the patient and medical records. No language interpreter was used.   Jordan Donovan is a 38 y.o. male  with a PMH of HTN who presents to the Emergency Department complaining of cough which began 3 days ago.  Yesterday, he developed nausea and the chills.  He threw up one time yesterday, but none since.  He only feels nauseous when he eats, therefore has not had anything to eat today.  Daughter was sick last week and told she had strep and a respiratory infection.  Patient denies sore throat, stating that his throat just feels little scratchy after he coughs.  He has not checked his temperature at home.  He has not felt as if he had fever, but did have a few chills.  He has a temperature of 102.1 triage.  No medications taken prior to arrival for his symptoms.  Denies difficulty breathing, headache, neck pain, abdominal pain, back pain, urinary symptoms.   Past Medical History:  Diagnosis Date  . Degenerative disc disease, lumbar     Patient Active Problem List   Diagnosis Date Noted  . Essential hypertension 10/16/2016  . GERD (gastroesophageal reflux disease) 10/17/2015  . Allergic rhinitis 06/10/2007  . DDD (degenerative disc disease), lumbar 03/04/2007    Past Surgical History:  Procedure Laterality Date  . CIRCUMCISION  1995  . CIRCUMCISION  1995  . TONSILLECTOMY    . TONSILLECTOMY AND ADENOIDECTOMY  1998        Home Medications    Prior to Admission medications   Medication Sig Start Date End Date Taking? Authorizing Provider  amLODipine (NORVASC) 10 MG tablet TAKE 1 TABLET(10 MG) BY MOUTH DAILY 09/25/17   Anola Gurney, PA  chlorthalidone (HYGROTON) 25 MG tablet TAKE 1 TABLET(25 MG) BY MOUTH DAILY 09/25/17    Anola Gurney, PA    Family History Family History  Problem Relation Age of Onset  . Healthy Mother   . Hypertension Mother   . Healthy Father   . Healthy Brother   . Healthy Daughter   . Healthy Son   . Healthy Daughter     Social History Social History   Tobacco Use  . Smoking status: Former Smoker    Types: Cigarettes    Last attempt to quit: 01/27/2005    Years since quitting: 13.0  . Smokeless tobacco: Never Used  Substance Use Topics  . Alcohol use: No    Alcohol/week: 0.0 standard drinks  . Drug use: Never     Allergies   Patient has no known allergies.   Review of Systems Review of Systems  Constitutional: Positive for chills and fever (Did not check at home, febrile in triage).  HENT: Positive for sore throat ("Scratchy").   Respiratory: Positive for cough. Negative for shortness of breath and wheezing.   Gastrointestinal: Positive for nausea. Negative for abdominal pain, constipation, diarrhea and vomiting.  All other systems reviewed and are negative.    Physical Exam Updated Vital Signs BP (!) 141/86 (BP Location: Right Arm)   Pulse (!) 120   Temp (!) 102.1 F (38.9 C) (Oral)   SpO2 97%   Physical Exam Vitals signs and nursing note reviewed.  Constitutional:  General: He is not in acute distress.    Appearance: He is well-developed.  HENT:     Head: Normocephalic and atraumatic.     Mouth/Throat:     Comments: Erythema, but no exudates or tonsillar hypertrophy. Cardiovascular:     Heart sounds: Normal heart sounds. No murmur.     Comments: Tachycardic, but regular. Pulmonary:     Effort: Pulmonary effort is normal. No respiratory distress.     Breath sounds: Normal breath sounds.  Abdominal:     General: There is no distension.     Palpations: Abdomen is soft.     Comments: No abdominal tenderness.  Skin:    General: Skin is warm and dry.  Neurological:     Mental Status: He is alert and oriented to person, place, and time.       ED Treatments / Results  Labs (all labs ordered are listed, but only abnormal results are displayed) Labs Reviewed  GROUP A STREP BY PCR  CBC WITH DIFFERENTIAL/PLATELET  COMPREHENSIVE METABOLIC PANEL  INFLUENZA PANEL BY PCR (TYPE A & B)    EKG None  Radiology No results found.  Procedures Procedures (including critical care time)  Medications Ordered in ED Medications  sodium chloride 0.9 % bolus 1,000 mL (has no administration in time range)  ondansetron (ZOFRAN) injection 4 mg (has no administration in time range)  acetaminophen (TYLENOL) tablet 1,000 mg (has no administration in time range)     Initial Impression / Assessment and Plan / ED Course  I have reviewed the triage vital signs and the nursing notes.  Pertinent labs & imaging results that were available during my care of the patient were reviewed by me and considered in my medical decision making (see chart for details).    Jordan Donovan is a 38 y.o. male who presents to ED for cough, congestion and nausea for the last 3 days.  On exam, patient febrile and tachycardic.  Given Tylenol with improvement in heart rate and temperature.  Leukocytosis of 14.0.  Potassium of 2.6.  Given 60 mEq p.o. potassium replacement.  Strep negative.  Chest x-ray does show medial right lower lobe pneumonia.  Started on azithromycin and Rocephin. Patient ambulated in the emergency department maintaining oxygenation of 91% and greater, however did report feeling quite dyspneic with ambulation.  Given his severe hypokalemia and dyspnea with ambulation, I consulted hospitalist, Dr. Toniann Fail.  He recommends giving further IV potassium supplementation and rechecking K.  He does not believe admission is warranted at this time.   Flu test and repeat BMP pending at shift change. Care assumed by oncoming provider Dr. Donnald Garre. Case discussed, plan agreed upon. Will follow up on repeat BMP and disposition appropriately.   Final  Clinical Impressions(s) / ED Diagnoses   Final diagnoses:  Cough with fever    ED Discharge Orders    None       Ward, Chase Picket, PA-C 01/31/18 2149    Arby Barrette, MD 02/02/18 [phone]

## 2018-02-01 MED ORDER — POTASSIUM CHLORIDE CRYS ER 20 MEQ PO TBCR: 20.0000 meq | EXTENDED_RELEASE_TABLET | Freq: Two times a day (BID) | ORAL | 0 refills | Status: DC

## 2018-02-01 MED ORDER — ACETAMINOPHEN 500 MG PO TABS: 1000.0000 mg | ORAL_TABLET | Freq: Four times a day (QID) | ORAL | 0 refills | Status: DC | PRN

## 2018-02-01 MED ORDER — ALBUTEROL SULFATE HFA 108 (90 BASE) MCG/ACT IN AERS
2.0000 | INHALATION_SPRAY | Freq: Once | RESPIRATORY_TRACT | Status: AC
  Administered 2018-02-01: 2 via RESPIRATORY_TRACT
  Filled 2018-02-01: qty 6.7

## 2018-02-01 MED ORDER — AZITHROMYCIN 250 MG PO TABS: 250.0000 mg | ORAL_TABLET | Freq: Every day | ORAL | 0 refills | Status: DC

## 2018-02-01 MED ORDER — HYDROCODONE-HOMATROPINE 5-1.5 MG/5ML PO SYRP: 5.0000 mL | ORAL_SOLUTION | ORAL | 0 refills | Status: DC | PRN

## 2018-02-01 NOTE — Discharge Instructions (Addendum)
1.  Take acetaminophen every 4-6 hours for fever or body aches.  You have been given antibiotics in the emergency department and do not need any repeat doses until tomorrow evening.  Fill the prescribed Zithromax and start that tomorrow evening. 2.  You may use hydrocodone syrup 1 teaspoon every 4 hours as needed for harsh coughing.  Also, use the albuterol inhaler every 4-6 hours for wheeze or cough. 3.  Make an appointment with your family doctor to be seen this week for recheck.  Return to the emergency department if your symptoms are worsening or changing.

## 2018-02-09 ENCOUNTER — Encounter: Payer: Self-pay | Admitting: Family Medicine

## 2018-02-09 ENCOUNTER — Ambulatory Visit (INDEPENDENT_AMBULATORY_CARE_PROVIDER_SITE_OTHER): Payer: BLUE CROSS/BLUE SHIELD | Admitting: Family Medicine

## 2018-02-09 VITALS — BP 120/90 | HR 91 | Temp 98.7°F | Resp 16 | Wt 307.6 lb

## 2018-02-09 DIAGNOSIS — J181 Lobar pneumonia, unspecified organism: Secondary | ICD-10-CM | POA: Diagnosis not present

## 2018-02-09 DIAGNOSIS — J189 Pneumonia, unspecified organism: Secondary | ICD-10-CM

## 2018-02-09 MED ORDER — BENZONATATE 100 MG PO CAPS: ORAL_CAPSULE | ORAL | 0 refills | Status: DC

## 2018-02-09 NOTE — Patient Instructions (Signed)
Do get your follow up x-ray in two weeks. It has been ordered. We will call with the results.

## 2018-02-09 NOTE — Progress Notes (Signed)
  Subjective:     Patient ID: Jordan Donovan, male   DOB: 1980-06-05, 38 y.o.   MRN: [id number] Chief Complaint  Patient presents with  . Pneumonia    Patient returns back to office today to follow up after being seen at [facility] ED on 01/31/18 with complaints of cough for 3 days. Patient states since hospital stay cough is improving but states that he still has some discomfort in his chest. Patient denies shortness of breath or wheezing.   HPI Reports residual dry cough but has returned to work.  Review of Systems     Objective:   Physical Exam Constitutional:      General: He is not in acute distress.    Appearance: He is not ill-appearing.  Pulmonary:     Effort: Pulmonary effort is normal.     Breath sounds: Normal breath sounds. No wheezing.  Neurological:     Mental Status: He is alert.        Assessment:    1. Pneumonia of right lower lobe due to infectious organism [facility]): resolved- - DG Chest 2 View; Future - benzonatate (TESSALON PERLES) 100 MG capsule; One or two pills 3 x day as needed for cough  Dispense: 30 capsule; Refill: 0    Plan:    Asked him to get the follow-up chest x-ray in two weeks to document clearance.

## 2018-02-10 ENCOUNTER — Other Ambulatory Visit: Payer: Self-pay | Admitting: Family Medicine

## 2018-02-10 DIAGNOSIS — I10 Essential (primary) hypertension: Secondary | ICD-10-CM

## 2018-02-11 ENCOUNTER — Other Ambulatory Visit: Payer: Self-pay | Admitting: Family Medicine

## 2018-02-11 MED ORDER — POTASSIUM CHLORIDE CRYS ER 20 MEQ PO TBCR: 20.0000 meq | EXTENDED_RELEASE_TABLET | Freq: Every day | ORAL | 3 refills | Status: DC

## 2018-02-22 ENCOUNTER — Ambulatory Visit
Admission: RE | Admit: 2018-02-22 | Discharge: 2018-02-22 | Disposition: A | Discharge status: Home / Self Care | Payer: BLUE CROSS/BLUE SHIELD | Attending: Family Medicine | Admitting: Family Medicine

## 2018-02-22 ENCOUNTER — Ambulatory Visit
Admission: RE | Admit: 2018-02-22 | Discharge: 2018-02-22 | Disposition: A | Discharge status: Home / Self Care | Payer: BLUE CROSS/BLUE SHIELD | Source: Ambulatory Visit | Attending: Family Medicine | Admitting: Family Medicine

## 2018-02-22 DIAGNOSIS — J181 Lobar pneumonia, unspecified organism: Secondary | ICD-10-CM | POA: Diagnosis not present

## 2018-02-22 DIAGNOSIS — J189 Pneumonia, unspecified organism: Secondary | ICD-10-CM

## 2018-03-27 ENCOUNTER — Other Ambulatory Visit: Payer: Self-pay | Admitting: Family Medicine

## 2018-03-27 DIAGNOSIS — I10 Essential (primary) hypertension: Secondary | ICD-10-CM

## 2018-03-27 NOTE — Telephone Encounter (Signed)
Patient of [facility] last filled 08/2017, please review chart. KW

## 2018-09-27 ENCOUNTER — Other Ambulatory Visit: Payer: Self-pay | Admitting: Family Medicine

## 2018-09-27 DIAGNOSIS — I10 Essential (primary) hypertension: Secondary | ICD-10-CM

## 2018-09-27 NOTE — Telephone Encounter (Signed)
[facility] faxed refill request for the following medications: ? ?chlorthalidone (HYGROTON) 25 MG tablet  ? ?Please advise. ? ?

## 2018-10-05 NOTE — Progress Notes (Signed)
Patient: Jordan Donovan, Male    DOB: 06-01-1980, 38 y.o.   MRN: [id number] Visit Date: 10/06/2018  Today's Provider: Shirlee LatchAngela Saed Hudlow, MD   Chief Complaint  Patient presents with   Annual Exam   Subjective:    Annual physical exam Jordan Donovan is a 38 y.o. male who presents today for health maintenance and complete physical. He feels well. He reports exercising some. He reports he is sleeping well. Working on weight loss and has lost 12 lbs in last 5 weeks -----------------------------------------------------------------   Review of Systems  Constitutional: Negative.   HENT: Negative.   Eyes: Negative.   Respiratory: Negative.   Cardiovascular: Negative.   Gastrointestinal: Negative.   Endocrine: Negative.   Genitourinary: Negative.   Musculoskeletal: Negative.   Skin: Negative.   Allergic/Immunologic: Negative.   Neurological: Negative.   Hematological: Negative.   Psychiatric/Behavioral: Negative.     Social History He  reports that he quit smoking about 13 years ago. His smoking use included cigarettes. He has never used smokeless tobacco. He reports that he does not drink alcohol or use drugs. Social History   Socioeconomic History   Marital status: Married    Spouse name: Not on file   Number of children: Not on file   Years of education: Not on file   Highest education level: Not on file  Occupational History   Not on file  Social Needs   Financial resource strain: Not on file   Food insecurity    Worry: Not on file    Inability: Not on file   Transportation needs    Medical: Not on file    Non-medical: Not on file  Tobacco Use   Smoking status: Former Smoker    Types: Cigarettes    Quit date: 01/27/2005    Years since quitting: 13.6   Smokeless tobacco: Never Used  Substance and Sexual Activity   Alcohol use: No    Alcohol/week: 0.0 standard drinks   Drug use: Never   Sexual activity: Not on file  Lifestyle    Physical activity    Days per week: Not on file    Minutes per session: Not on file   Stress: Not on file  Relationships   Social connections    Talks on phone: Not on file    Gets together: Not on file    Attends religious service: Not on file    Active member of club or organization: Not on file    Attends meetings of clubs or organizations: Not on file    Relationship status: Not on file  Other Topics Concern   Not on file  Social History Narrative   Not on file    Patient Active Problem List   Diagnosis Date Noted   Essential hypertension 10/16/2016   GERD (gastroesophageal reflux disease) 10/17/2015   Allergic rhinitis 06/10/2007   DDD (degenerative disc disease), lumbar 03/04/2007    Past Surgical History:  Procedure Laterality Date   CIRCUMCISION  1995   CIRCUMCISION  1995   TONSILLECTOMY     TONSILLECTOMY AND ADENOIDECTOMY  1998    Family History  Family Status  Relation Name Status   Mother  Alive   Father  Alive   Brother  Alive   Daughter  Alive   Son  Alive   Daughter  Alive   His family history includes Healthy in his brother, daughter, daughter, father, mother, and son; Hypertension in his mother.  No Known Allergies  Previous Medications   ACETAMINOPHEN (TYLENOL) 500 MG TABLET    Take 2 tablets (1,000 mg total) by mouth every 6 (six) hours as needed.   AMLODIPINE (NORVASC) 10 MG TABLET    TAKE 1 TABLET(10 MG) BY MOUTH DAILY   BENZONATATE (TESSALON PERLES) 100 MG CAPSULE    One or two pills 3 x day as needed for cough   CHLORTHALIDONE (HYGROTON) 25 MG TABLET    TAKE 1 TABLET(25 MG) BY MOUTH DAILY   HYDROCODONE-HOMATROPINE (HYCODAN) 5-1.5 MG/5ML SYRUP    Take 5 mLs by mouth every 4 (four) hours as needed for cough.   POTASSIUM CHLORIDE SA (K-DUR,KLOR-CON) 20 MEQ TABLET    Take 1 tablet (20 mEq total) by mouth daily.    Patient Care Team: Virginia Crews, MD as PCP - General (Family Medicine)      Objective:    Vitals: BP 138/88 (BP Location: Left Arm, Patient Position: Sitting, Cuff Size: Normal)    Temp (!) 97.3 F (36.3 C) (Temporal)    Ht 6\' 5"  (1.956 m)    Wt (!) 315 lb 9.6 oz (143.2 kg)    BMI 37.42 kg/m    Physical Exam Vitals signs reviewed.  Constitutional:      General: He is not in acute distress.    Appearance: Normal appearance. He is well-developed. He is not diaphoretic.  HENT:     Head: Normocephalic and atraumatic.     Right Ear: Tympanic membrane, ear canal and external ear normal.     Left Ear: Tympanic membrane, ear canal and external ear normal.  Eyes:     General: No scleral icterus.    Conjunctiva/sclera: Conjunctivae normal.     Pupils: Pupils are equal, round, and reactive to light.  Neck:     Musculoskeletal: Neck supple.     Thyroid: No thyromegaly.  Cardiovascular:     Rate and Rhythm: Normal rate and regular rhythm.     Pulses: Normal pulses.     Heart sounds: Normal heart sounds. No murmur.  Pulmonary:     Effort: Pulmonary effort is normal. No respiratory distress.     Breath sounds: Normal breath sounds. No wheezing or rales.  Abdominal:     General: There is no distension.     Palpations: Abdomen is soft.     Tenderness: There is no abdominal tenderness. There is no guarding or rebound.  Musculoskeletal:        General: No deformity.     Right lower leg: No edema.     Left lower leg: No edema.  Lymphadenopathy:     Cervical: No cervical adenopathy.  Skin:    General: Skin is warm and dry.     Capillary Refill: Capillary refill takes less than 2 seconds.     Findings: No rash.  Neurological:     Mental Status: He is alert and oriented to person, place, and time. Mental status is at baseline.  Psychiatric:        Mood and Affect: Mood normal.        Behavior: Behavior normal.        Thought Content: Thought content normal.      Depression Screen PHQ 2/9 Scores 10/06/2018 10/02/2016  PHQ - 2 Score 0 0  PHQ- 9 Score 0 -      Assessment  & Plan:     Routine Health Maintenance and Physical Exam  Exercise Activities and Dietary recommendations Goals   None  Immunization History  Administered Date(s) Administered   Tdap 10/02/2016    Health Maintenance  Topic Date Due   HIV Screening  04/10/1994   INFLUENZA VACCINE  08/28/2018   TETANUS/TDAP  10/03/2026     Discussed health benefits of physical activity, and encouraged him to engage in regular exercise appropriate for his age and condition.    --------------------------------------------------------------------  Problem List Items Addressed This Visit      Cardiovascular and Mediastinum   Essential hypertension    Well controlled Continue current medications Recheck metabolic panel F/u in 6 months       Relevant Medications   chlorthalidone (HYGROTON) 25 MG tablet   amLODipine (NORVASC) 10 MG tablet   Other Relevant Orders   Comprehensive metabolic panel    Other Visit Diagnoses    Encounter for annual physical exam    -  Primary   Relevant Orders   Lipid panel   Comprehensive metabolic panel   Hemoglobin A1c   HIV antibody (with reflex)   Screening for HIV (human immunodeficiency virus)       Relevant Orders   HIV antibody (with reflex)   Hyperglycemia       Relevant Orders   Hemoglobin A1c   Mixed hyperlipidemia       Relevant Medications   chlorthalidone (HYGROTON) 25 MG tablet   amLODipine (NORVASC) 10 MG tablet   Other Relevant Orders   Lipid panel   Comprehensive metabolic panel       Return in about 6 months (around 04/05/2019) for chronic disease f/u.   The entirety of the information documented in the History of Present Illness, Review of Systems and Physical Exam were personally obtained by me. Portions of this information were initially documented by [facility], CMA and reviewed by me for thoroughness and accuracy.    Denishia Citro, Marzella Schlein, MD MPH [facility]

## 2018-10-06 ENCOUNTER — Ambulatory Visit (INDEPENDENT_AMBULATORY_CARE_PROVIDER_SITE_OTHER): Payer: BC Managed Care – PPO | Admitting: Family Medicine

## 2018-10-06 ENCOUNTER — Other Ambulatory Visit: Payer: Self-pay

## 2018-10-06 ENCOUNTER — Encounter: Payer: Self-pay | Admitting: Family Medicine

## 2018-10-06 VITALS — BP 138/88 | Temp 97.3°F | Ht 77.0 in | Wt 315.6 lb

## 2018-10-06 DIAGNOSIS — R739 Hyperglycemia, unspecified: Secondary | ICD-10-CM

## 2018-10-06 DIAGNOSIS — Z114 Encounter for screening for human immunodeficiency virus [HIV]: Secondary | ICD-10-CM

## 2018-10-06 DIAGNOSIS — I10 Essential (primary) hypertension: Secondary | ICD-10-CM

## 2018-10-06 DIAGNOSIS — Z0000 Encounter for general adult medical examination without abnormal findings: Secondary | ICD-10-CM | POA: Diagnosis not present

## 2018-10-06 DIAGNOSIS — E782 Mixed hyperlipidemia: Secondary | ICD-10-CM | POA: Diagnosis not present

## 2018-10-06 MED ORDER — AMLODIPINE BESYLATE 10 MG PO TABS: ORAL_TABLET | ORAL | 4 refills | Status: DC

## 2018-10-06 MED ORDER — CHLORTHALIDONE 25 MG PO TABS: ORAL_TABLET | ORAL | 1 refills | Status: DC

## 2018-10-06 NOTE — Assessment & Plan Note (Signed)
Well controlled Continue current medications Recheck metabolic panel F/u in 6 months  

## 2018-10-06 NOTE — Patient Instructions (Signed)
Preventive Care 19-39 Years Old, Male Preventive care refers to lifestyle choices and visits with your health care provider that can promote health and wellness. This includes:  A yearly physical exam. This is also called an annual well check.  Regular dental and eye exams.  Immunizations.  Screening for certain conditions.  Healthy lifestyle choices, such as eating a healthy diet, getting regular exercise, not using drugs or products that contain nicotine and tobacco, and limiting alcohol use. What can I expect for my preventive care visit? Physical exam Your health care provider will check:  Height and weight. These may be used to calculate body mass index (BMI), which is a measurement that tells if you are at a healthy weight.  Heart rate and blood pressure.  Your skin for abnormal spots. Counseling Your health care provider may ask you questions about:  Alcohol, tobacco, and drug use.  Emotional well-being.  Home and relationship well-being.  Sexual activity.  Eating habits.  Work and work Statistician. What immunizations do I need?  Influenza (flu) vaccine  This is recommended every year. Tetanus, diphtheria, and pertussis (Tdap) vaccine  You may need a Td booster every 10 years. Varicella (chickenpox) vaccine  You may need this vaccine if you have not already been vaccinated. Human papillomavirus (HPV) vaccine  If recommended by your health care provider, you may need three doses over 6 months. Measles, mumps, and rubella (MMR) vaccine  You may need at least one dose of MMR. You may also need a second dose. Meningococcal conjugate (MenACWY) vaccine  One dose is recommended if you are 45-76 years old and a Market researcher living in a residence hall, or if you have one of several medical conditions. You may also need additional booster doses. Pneumococcal conjugate (PCV13) vaccine  You may need this if you have certain conditions and were not  previously vaccinated. Pneumococcal polysaccharide (PPSV23) vaccine  You may need one or two doses if you smoke cigarettes or if you have certain conditions. Hepatitis A vaccine  You may need this if you have certain conditions or if you travel or work in places where you may be exposed to hepatitis A. Hepatitis B vaccine  You may need this if you have certain conditions or if you travel or work in places where you may be exposed to hepatitis B. Haemophilus influenzae type b (Hib) vaccine  You may need this if you have certain risk factors. You may receive vaccines as individual doses or as more than one vaccine together in one shot (combination vaccines). Talk with your health care provider about the risks and benefits of combination vaccines. What tests do I need? Blood tests  Lipid and cholesterol levels. These may be checked every 5 years starting at age 17.  Hepatitis C test.  Hepatitis B test. Screening   Diabetes screening. This is done by checking your blood sugar (glucose) after you have not eaten for a while (fasting).  Sexually transmitted disease (STD) testing. Talk with your health care provider about your test results, treatment options, and if necessary, the need for more tests. Follow these instructions at home: Eating and drinking   Eat a diet that includes fresh fruits and vegetables, whole grains, lean protein, and low-fat dairy products.  Take vitamin and mineral supplements as recommended by your health care provider.  Do not drink alcohol if your health care provider tells you not to drink.  If you drink alcohol: ? Limit how much you have to 0-2  drinks a day. ? Be aware of how much alcohol is in your drink. In the [place] one drink equals one 12 oz bottle of beer (355 mL), one 5 oz glass of wine (148 mL), or one 1 oz glass of hard liquor (44 mL). Lifestyle  Take daily care of your teeth and gums.  Stay active. Exercise for at least 30 minutes on 5 or  more days each week.  Do not use any products that contain nicotine or tobacco, such as cigarettes, e-cigarettes, and chewing tobacco. If you need help quitting, ask your health care provider.  If you are sexually active, practice safe sex. Use a condom or other form of protection to prevent STIs (sexually transmitted infections). What's next?  Go to your health care provider once a year for a well check visit.  Ask your health care provider how often you should have your eyes and teeth checked.  Stay up to date on all vaccines. This information is not intended to replace advice given to you by your health care provider. Make sure you discuss any questions you have with your health care provider. Document Released: 03/11/2001 Document Revised: 01/07/2018 Document Reviewed: 01/07/2018 Elsevier Patient Education  2020 Elsevier Inc.  

## 2018-10-07 LAB — COMPREHENSIVE METABOLIC PANEL
ALT: 44 IU/L (ref 0–44)
AST: 24 IU/L (ref 0–40)
Albumin/Globulin Ratio: 1.7 (ref 1.2–2.2)
Albumin: 4.8 g/dL (ref 4.0–5.0)
Alkaline Phosphatase: 81 IU/L (ref 39–117)
BUN/Creatinine Ratio: 16 (ref 9–20)
BUN: 19 mg/dL (ref 6–20)
Bilirubin Total: 0.5 mg/dL (ref 0.0–1.2)
CO2: 27 mmol/L (ref 20–29)
Calcium: 9.7 mg/dL (ref 8.7–10.2)
Chloride: 95 mmol/L — ABNORMAL LOW (ref 96–106)
Creatinine, Ser: 1.19 mg/dL (ref 0.76–1.27)
GFR calc Af Amer: 88 mL/min/{1.73_m2} (ref >59)
GFR calc non Af Amer: 76 mL/min/{1.73_m2} (ref >59)
Globulin, Total: 2.8 g/dL (ref 1.5–4.5)
Glucose: 81 mg/dL (ref 65–99)
Potassium: 3.4 mmol/L — ABNORMAL LOW (ref 3.5–5.2)
Sodium: 140 mmol/L (ref 134–144)
Total Protein: 7.6 g/dL (ref 6.0–8.5)

## 2018-10-07 LAB — LIPID PANEL
Chol/HDL Ratio: 4.5 ratio (ref 0.0–5.0)
Cholesterol, Total: 227 mg/dL — ABNORMAL HIGH (ref 100–199)
HDL: 50 mg/dL (ref >39)
LDL Chol Calc (NIH): 156 mg/dL — ABNORMAL HIGH (ref 0–99)
Triglycerides: 116 mg/dL (ref 0–149)
VLDL Cholesterol Cal: 21 mg/dL (ref 5–40)

## 2018-10-07 LAB — HIV ANTIBODY (ROUTINE TESTING W REFLEX): HIV Screen 4th Generation wRfx: NONREACTIVE

## 2018-10-07 LAB — HEMOGLOBIN A1C
Est. average glucose Bld gHb Est-mCnc: 128 mg/dL
Hgb A1c MFr Bld: 6.1 % — ABNORMAL HIGH (ref 4.8–5.6)

## 2018-10-08 ENCOUNTER — Telehealth: Payer: Self-pay

## 2018-10-08 NOTE — Telephone Encounter (Signed)
Pt advised.   Thanks,   -Laura  

## 2018-10-08 NOTE — Telephone Encounter (Signed)
-----   Message from Virginia Crews, MD sent at 10/07/2018  4:51 PM EDT ----- Cholesterol is high.  This is stable from 1 year ago, however.  I recommend diet low in saturated fat and regular exercise - 30 min at least 5 times per week.  No medication needed at this time, but if this remains elevated, this is likely going to be needed in the future  Hemoglobin A1c, three-month average of blood sugars, is in the prediabetic range.  Recommend low-carb diet and regular exercise.  Normal kidney and liver function.  Potassium is very slightly low.  I recommend eating more potassium rich foods

## 2018-10-21 ENCOUNTER — Other Ambulatory Visit: Payer: Self-pay

## 2018-10-21 ENCOUNTER — Ambulatory Visit: Payer: Self-pay

## 2018-10-21 DIAGNOSIS — Z008 Encounter for other general examination: Secondary | ICD-10-CM

## 2018-10-21 LAB — POCT LIPID PANEL
Glucose Fasting, POC: 99 mg/dL (ref 70–99)
HDL: 51
LDL: 114
Non-HDL: 145
TC/HDL: 3.8
TC: 196
TRG: 154

## 2018-10-21 NOTE — Progress Notes (Signed)
     Patient ID: ASAHD CAN, male    DOB: 1980-10-22, 38 y.o.   MRN: [id number]    Thank you!!  Apolonio Schneiders RN  [place] Nurse Specialist JAARS: [phone]  Cell:  [phone] Website: [email or web address]

## 2019-04-06 ENCOUNTER — Other Ambulatory Visit: Payer: Self-pay

## 2019-04-06 ENCOUNTER — Encounter: Payer: Self-pay | Admitting: Family Medicine

## 2019-04-06 ENCOUNTER — Ambulatory Visit (INDEPENDENT_AMBULATORY_CARE_PROVIDER_SITE_OTHER): Payer: BC Managed Care – PPO | Admitting: Family Medicine

## 2019-04-06 VITALS — BP 121/84 | HR 94 | Temp 97.1°F | Resp 16 | Wt 320.0 lb

## 2019-04-06 DIAGNOSIS — I10 Essential (primary) hypertension: Secondary | ICD-10-CM | POA: Diagnosis not present

## 2019-04-06 DIAGNOSIS — E669 Obesity, unspecified: Secondary | ICD-10-CM | POA: Diagnosis not present

## 2019-04-06 DIAGNOSIS — Z6837 Body mass index (BMI) 37.0-37.9, adult: Secondary | ICD-10-CM

## 2019-04-06 MED ORDER — CHLORTHALIDONE 25 MG PO TABS: ORAL_TABLET | ORAL | 3 refills | Status: DC

## 2019-04-06 MED ORDER — AMLODIPINE BESYLATE 10 MG PO TABS: ORAL_TABLET | ORAL | 4 refills | Status: DC

## 2019-04-06 NOTE — Patient Instructions (Signed)

## 2019-04-06 NOTE — Assessment & Plan Note (Signed)
Discussed importance of healthy weight management Discussed diet and exercise  

## 2019-04-06 NOTE — Assessment & Plan Note (Signed)
Well controlled Continue current medications Recheck metabolic panel F/u in 6 months  

## 2019-04-06 NOTE — Progress Notes (Signed)
Patient: Jordan Donovan Male    DOB: 04/15/1980   39 y.o.   MRN: [id number] Visit Date: 04/06/2019  Today's Provider: Lavon Paganini, MD   Chief Complaint  Patient presents with  . Hypertension   Subjective:    I [place] S. Dimas, CMA, am acting as scribe for Lavon Paganini, MD.  HPI  Hypertension, follow-up:  BP Readings from Last 3 Encounters:  04/06/19 121/84  10/06/18 138/88  02/09/18 120/90    He was last seen for hypertension 6 months ago.  BP at that visit was 138/88. Management changes since that visit include no changes. He reports excellent compliance with treatment. He is not having side effects.  He is exercising. He is adherent to low salt diet.   Outside blood pressures are not being checked. He is experiencing none.  Patient denies chest pain, dyspnea, lower extremity edema and palpitations.   Cardiovascular risk factors include hypertension and obesity (BMI >= 30 kg/m2).  Use of agents associated with hypertension: none.     Weight trend: stable Wt Readings from Last 3 Encounters:  04/06/19 (!) 320 lb (145.2 kg)  10/06/18 (!) 315 lb 9.6 oz (143.2 kg)  02/09/18 (!) 307 lb 9.6 oz (139.5 kg)   Current diet: not asked ------------------------------------------------------------------------   No Known Allergies   Current Outpatient Medications:  .  amLODipine (NORVASC) 10 MG tablet, TAKE 1 TABLET(10 MG) BY MOUTH DAILY, Disp: 90 tablet, Rfl: 4 .  chlorthalidone (HYGROTON) 25 MG tablet, TAKE 1 TABLET(25 MG) BY MOUTH DAILY, Disp: 90 tablet, Rfl: 1  Review of Systems  Constitutional: Negative.   Respiratory: Negative.   Cardiovascular: Negative.     Social History   Tobacco Use  . Smoking status: Former Smoker    Types: Cigarettes    Quit date: 01/27/2005    Years since quitting: 14.1  . Smokeless tobacco: Never Used  Substance Use Topics  . Alcohol use: No    Alcohol/week: 0.0 standard drinks      Objective:   BP  121/84 (BP Location: Left Arm, Patient Position: Sitting, Cuff Size: Large)   Pulse 94   Temp (!) 97.1 F (36.2 C) (Temporal)   Resp 16   Wt (!) 320 lb (145.2 kg)   BMI 37.95 kg/m  Vitals:   04/06/19 1331  BP: 121/84  Pulse: 94  Resp: 16  Temp: (!) 97.1 F (36.2 C)  TempSrc: Temporal  Weight: (!) 320 lb (145.2 kg)  Body mass index is 37.95 kg/m.   Physical Exam Vitals reviewed.  Constitutional:      General: He is not in acute distress.    Appearance: Normal appearance. He is not diaphoretic.  HENT:     Head: Normocephalic and atraumatic.  Eyes:     General: No scleral icterus.    Conjunctiva/sclera: Conjunctivae normal.  Cardiovascular:     Rate and Rhythm: Normal rate and regular rhythm.     Pulses: Normal pulses.     Heart sounds: Normal heart sounds. No murmur.  Pulmonary:     Effort: Pulmonary effort is normal. No respiratory distress.     Breath sounds: Normal breath sounds. No wheezing or rhonchi.  Abdominal:     General: There is no distension.     Palpations: Abdomen is soft.     Tenderness: There is no abdominal tenderness.  Musculoskeletal:     Cervical back: Neck supple.     Right lower leg: No edema.  Left lower leg: No edema.  Lymphadenopathy:     Cervical: No cervical adenopathy.  Skin:    General: Skin is warm and dry.     Findings: No rash.  Neurological:     Mental Status: He is alert and oriented to person, place, and time.     Cranial Nerves: No cranial nerve deficit.  Psychiatric:        Mood and Affect: Mood normal.        Behavior: Behavior normal.      No results found for any visits on 04/06/19.     Assessment & Plan    Problem List Items Addressed This Visit      Cardiovascular and Mediastinum   Essential hypertension - Primary    Well controlled Continue current medications Recheck metabolic panel F/u in 6 months       Relevant Medications   chlorthalidone (HYGROTON) 25 MG tablet   amLODipine (NORVASC) 10 MG  tablet   Other Relevant Orders   Basic Metabolic Panel (BMET)     Other   Class 2 obesity without serious comorbidity with body mass index (BMI) of 37.0 to 37.9 in adult    Discussed importance of healthy weight management Discussed diet and exercise           Return in about 6 months (around 10/07/2019) for CPE.   The entirety of the information documented in the History of Present Illness, Review of Systems and Physical Exam were personally obtained by me. Portions of this information were initially documented by Rondel Baton, CMA and reviewed by me for thoroughness and accuracy.    Phoenyx Melka, Marzella Schlein, MD MPH [facility]

## 2019-04-07 ENCOUNTER — Telehealth: Payer: Self-pay

## 2019-04-07 LAB — BASIC METABOLIC PANEL
BUN/Creatinine Ratio: 14 (ref 9–20)
BUN: 16 mg/dL (ref 6–20)
CO2: 23 mmol/L (ref 20–29)
Calcium: 9.6 mg/dL (ref 8.7–10.2)
Chloride: 100 mmol/L (ref 96–106)
Creatinine, Ser: 1.12 mg/dL (ref 0.76–1.27)
GFR calc Af Amer: 95 mL/min/{1.73_m2} (ref >59)
GFR calc non Af Amer: 82 mL/min/{1.73_m2} (ref >59)
Glucose: 81 mg/dL (ref 65–99)
Potassium: 3.6 mmol/L (ref 3.5–5.2)
Sodium: 143 mmol/L (ref 134–144)

## 2019-04-07 NOTE — Telephone Encounter (Signed)
[employer]   Result Notes and Comments to Patient Comment seen by patient Jordan Donovan on 04/07/2019 8:39 AM EST

## 2019-04-07 NOTE — Telephone Encounter (Signed)
-----   Message from Erasmo Downer, MD sent at 04/07/2019  8:34 AM EST ----- Normal labs

## 2019-10-07 ENCOUNTER — Ambulatory Visit (INDEPENDENT_AMBULATORY_CARE_PROVIDER_SITE_OTHER): Payer: BC Managed Care – PPO | Admitting: Family Medicine

## 2019-10-07 ENCOUNTER — Encounter: Payer: Self-pay | Admitting: Family Medicine

## 2019-10-07 ENCOUNTER — Other Ambulatory Visit: Payer: Self-pay

## 2019-10-07 VITALS — BP 124/80 | HR 88 | Temp 98.3°F | Resp 16 | Ht 77.0 in | Wt 320.0 lb

## 2019-10-07 DIAGNOSIS — I10 Essential (primary) hypertension: Secondary | ICD-10-CM | POA: Diagnosis not present

## 2019-10-07 DIAGNOSIS — R7303 Prediabetes: Secondary | ICD-10-CM | POA: Insufficient documentation

## 2019-10-07 DIAGNOSIS — E782 Mixed hyperlipidemia: Secondary | ICD-10-CM

## 2019-10-07 DIAGNOSIS — E785 Hyperlipidemia, unspecified: Secondary | ICD-10-CM | POA: Insufficient documentation

## 2019-10-07 DIAGNOSIS — Z0000 Encounter for general adult medical examination without abnormal findings: Secondary | ICD-10-CM | POA: Diagnosis not present

## 2019-10-07 DIAGNOSIS — E1169 Type 2 diabetes mellitus with other specified complication: Secondary | ICD-10-CM | POA: Insufficient documentation

## 2019-10-07 DIAGNOSIS — Z1159 Encounter for screening for other viral diseases: Secondary | ICD-10-CM

## 2019-10-07 NOTE — Assessment & Plan Note (Signed)
BMI >35 with HTN and HLD Discussed importance of healthy weight management Discussed diet and exercise

## 2019-10-07 NOTE — Assessment & Plan Note (Signed)
Well controlled Continue current medications Recheck metabolic panel F/u in 6 months  

## 2019-10-07 NOTE — Patient Instructions (Signed)
Preventive Care 41-40 Years Old, Male Preventive care refers to lifestyle choices and visits with your health care provider that can promote health and wellness. This includes:  A yearly physical exam. This is also called an annual well check.  Regular dental and eye exams.  Immunizations.  Screening for certain conditions.  Healthy lifestyle choices, such as eating a healthy diet, getting regular exercise, not using drugs or products that contain nicotine and tobacco, and limiting alcohol use. What can I expect for my preventive care visit? Physical exam Your health care provider will check:  Height and weight. These may be used to calculate body mass index (BMI), which is a measurement that tells if you are at a healthy weight.  Heart rate and blood pressure.  Your skin for abnormal spots. Counseling Your health care provider may ask you questions about:  Alcohol, tobacco, and drug use.  Emotional well-being.  Home and relationship well-being.  Sexual activity.  Eating habits.  Work and work Statistician. What immunizations do I need?  Influenza (flu) vaccine  This is recommended every year. Tetanus, diphtheria, and pertussis (Tdap) vaccine  You may need a Td booster every 10 years. Varicella (chickenpox) vaccine  You may need this vaccine if you have not already been vaccinated. Zoster (shingles) vaccine  You may need this after age 64. Measles, mumps, and rubella (MMR) vaccine  You may need at least one dose of MMR if you were born in 1957 or later. You may also need a second dose. Pneumococcal conjugate (PCV13) vaccine  You may need this if you have certain conditions and were not previously vaccinated. Pneumococcal polysaccharide (PPSV23) vaccine  You may need one or two doses if you smoke cigarettes or if you have certain conditions. Meningococcal conjugate (MenACWY) vaccine  You may need this if you have certain conditions. Hepatitis A  vaccine  You may need this if you have certain conditions or if you travel or work in places where you may be exposed to hepatitis A. Hepatitis B vaccine  You may need this if you have certain conditions or if you travel or work in places where you may be exposed to hepatitis B. Haemophilus influenzae type b (Hib) vaccine  You may need this if you have certain risk factors. Human papillomavirus (HPV) vaccine  If recommended by your health care provider, you may need three doses over 6 months. You may receive vaccines as individual doses or as more than one vaccine together in one shot (combination vaccines). Talk with your health care provider about the risks and benefits of combination vaccines. What tests do I need? Blood tests  Lipid and cholesterol levels. These may be checked every 5 years, or more frequently if you are over 60 years old.  Hepatitis C test.  Hepatitis B test. Screening  Lung cancer screening. You may have this screening every year starting at age 43 if you have a 30-pack-year history of smoking and currently smoke or have quit within the past 15 years.  Prostate cancer screening. Recommendations will vary depending on your family history and other risks.  Colorectal cancer screening. All adults should have this screening starting at age 72 and continuing until age 2. Your health care provider may recommend screening at age 14 if you are at increased risk. You will have tests every 1-10 years, depending on your results and the type of screening test.  Diabetes screening. This is done by checking your blood sugar (glucose) after you have not eaten  for a while (fasting). You may have this done every 1-3 years.  Sexually transmitted disease (STD) testing. Follow these instructions at home: Eating and drinking  Eat a diet that includes fresh fruits and vegetables, whole grains, lean protein, and low-fat dairy products.  Take vitamin and mineral supplements as  recommended by your health care provider.  Do not drink alcohol if your health care provider tells you not to drink.  If you drink alcohol: ? Limit how much you have to 0-2 drinks a day. ? Be aware of how much alcohol is in your drink. In the [place] one drink equals one 12 oz bottle of beer (355 mL), one 5 oz glass of wine (148 mL), or one 1 oz glass of hard liquor (44 mL). Lifestyle  Take daily care of your teeth and gums.  Stay active. Exercise for at least 30 minutes on 5 or more days each week.  Do not use any products that contain nicotine or tobacco, such as cigarettes, e-cigarettes, and chewing tobacco. If you need help quitting, ask your health care provider.  If you are sexually active, practice safe sex. Use a condom or other form of protection to prevent STIs (sexually transmitted infections).  Talk with your health care provider about taking a low-dose aspirin every day starting at age 53. What's next?  Go to your health care provider once a year for a well check visit.  Ask your health care provider how often you should have your eyes and teeth checked.  Stay up to date on all vaccines. This information is not intended to replace advice given to you by your health care provider. Make sure you discuss any questions you have with your health care provider. Document Revised: 01/07/2018 Document Reviewed: 01/07/2018 Elsevier Patient Education  2020 [employer].

## 2019-10-07 NOTE — Assessment & Plan Note (Signed)
Reviewed last lipid panel Not currently on a statin Recheck FLP and CMP Discussed diet and exercise  

## 2019-10-07 NOTE — Assessment & Plan Note (Signed)
Recommend low carb diet °Recheck A1c  °

## 2019-10-07 NOTE — Progress Notes (Signed)
I,Jordan Donovan,acting as a scribe for Jordan Latch, MD.,have documented all relevant documentation on the behalf of Jordan Latch, MD,as directed by  Jordan Latch, MD while in the presence of Jordan Latch, MD.   Complete physical exam   Patient: Jordan Donovan   DOB: [birth year]   39 y.o. Male  MRN: [id number] Visit Date: 10/07/2019  Today's healthcare provider: Shirlee Latch, MD   Chief Complaint  Patient presents with  . Annual Exam   Subjective    Jordan Donovan is a 39 y.o. male who presents today for a complete physical exam.  He reports consuming a general diet. Home exercise routine includes some walking. He generally feels well. He reports sleeping well. He does not have additional problems to discuss today.  HPI    Past Medical History:  Diagnosis Date  . Degenerative disc disease, lumbar    Past Surgical History:  Procedure Laterality Date  . CIRCUMCISION  1995  . CIRCUMCISION  1995  . TONSILLECTOMY    . TONSILLECTOMY AND ADENOIDECTOMY  1998   Social History   Socioeconomic History  . Marital status: Married    Spouse name: Not on file  . Number of children: Not on file  . Years of education: Not on file  . Highest education level: Not on file  Occupational History  . Not on file  Tobacco Use  . Smoking status: Former Smoker    Types: Cigarettes    Quit date: 01/27/2005    Years since quitting: 14.7  . Smokeless tobacco: Never Used  Substance and Sexual Activity  . Alcohol use: No    Alcohol/week: 0.0 standard drinks  . Drug use: Never  . Sexual activity: Not on file  Other Topics Concern  . Not on file  Social History Narrative  . Not on file   Social Determinants of Health   Financial Resource Strain:   . Difficulty of Paying Living Expenses: Not on file  Food Insecurity:   . Worried About Programme researcher, broadcasting/film/video in the Last Year: Not on file  . Ran Out of Food in the Last Year: Not on file  Transportation  Needs:   . Lack of Transportation (Medical): Not on file  . Lack of Transportation (Non-Medical): Not on file  Physical Activity:   . Days of Exercise per Week: Not on file  . Minutes of Exercise per Session: Not on file  Stress:   . Feeling of Stress : Not on file  Social Connections:   . Frequency of Communication with Friends and Family: Not on file  . Frequency of Social Gatherings with Friends and Family: Not on file  . Attends Religious Services: Not on file  . Active Member of Clubs or Organizations: Not on file  . Attends Banker Meetings: Not on file  . Marital Status: Not on file  Intimate Partner Violence:   . Fear of Current or Ex-Partner: Not on file  . Emotionally Abused: Not on file  . Physically Abused: Not on file  . Sexually Abused: Not on file   Family Status  Relation Name Status  . Mother  Alive  . Father  Alive  . Brother  Alive  . Daughter  Alive  . Son  Alive  . Daughter  Alive   Family History  Problem Relation Age of Onset  . Healthy Mother   . Hypertension Mother   . Healthy Father   . Healthy Brother   . Healthy  Daughter   . Healthy Son   . Healthy Daughter    No Known Allergies  Patient Care Team: Erasmo Downer, MD as PCP - General (Family Medicine)   Medications: Outpatient Medications Prior to Visit  Medication Sig  . amLODipine (NORVASC) 10 MG tablet TAKE 1 TABLET(10 MG) BY MOUTH DAILY  . chlorthalidone (HYGROTON) 25 MG tablet TAKE 1 TABLET(25 MG) BY MOUTH DAILY   No facility-administered medications prior to visit.    Review of Systems  All other systems reviewed and are negative.     Objective    BP 124/80 (BP Location: Left Arm, Patient Position: Sitting, Cuff Size: Large)   Pulse 88   Temp 98.3 F (36.8 C) (Oral)   Resp 16   Ht 6\' 5"  (1.956 m)   Wt (!) 320 lb (145.2 kg)   SpO2 96%   BMI 37.95 kg/m    Physical Exam Vitals reviewed.  Constitutional:      General: He is not in acute  distress.    Appearance: Normal appearance. He is well-developed. He is not diaphoretic.  HENT:     Head: Normocephalic and atraumatic.     Right Ear: Tympanic membrane, ear canal and external ear normal.     Left Ear: Tympanic membrane, ear canal and external ear normal.  Eyes:     General: No scleral icterus.    Conjunctiva/sclera: Conjunctivae normal.     Pupils: Pupils are equal, round, and reactive to light.  Neck:     Thyroid: No thyromegaly.  Cardiovascular:     Rate and Rhythm: Normal rate and regular rhythm.     Pulses: Normal pulses.     Heart sounds: Normal heart sounds. No murmur heard.   Pulmonary:     Effort: Pulmonary effort is normal. No respiratory distress.     Breath sounds: Normal breath sounds. No wheezing or rales.  Abdominal:     General: There is no distension.     Palpations: Abdomen is soft.     Tenderness: There is no abdominal tenderness.  Musculoskeletal:        General: No deformity.     Cervical back: Neck supple.     Right lower leg: No edema.     Left lower leg: No edema.  Lymphadenopathy:     Cervical: No cervical adenopathy.  Skin:    General: Skin is warm and dry.     Findings: No rash.  Neurological:     Mental Status: He is alert and oriented to person, place, and time. Mental status is at baseline.     Sensory: No sensory deficit.     Motor: No weakness.     Gait: Gait normal.  Psychiatric:        Mood and Affect: Mood normal.        Behavior: Behavior normal.        Thought Content: Thought content normal.     Last depression screening scores PHQ 2/9 Scores 10/07/2019 10/06/2018 10/02/2016  PHQ - 2 Score 0 0 0  PHQ- 9 Score 0 0 -   Last fall risk screening Fall Risk  10/06/2018  Falls in the past year? 0  Number falls in past yr: 0  Injury with Fall? 0   Last Audit-C alcohol use screening Alcohol Use Disorder Test (AUDIT) 10/07/2019  1. How often do you have a drink containing alcohol? 1  2. How many drinks containing alcohol  do you have on a typical day when you  are drinking? 0  3. How often do you have six or more drinks on one occasion? 0  AUDIT-C Score 1  Alcohol Brief Interventions/Follow-up AUDIT Score <7 follow-up not indicated   A score of 3 or more in women, and 4 or more in men indicates increased risk for alcohol abuse, EXCEPT if all of the points are from question 1   No results found for any visits on 10/07/19.  Assessment & Plan    Routine Health Maintenance and Physical Exam  Exercise Activities and Dietary recommendations Goals   None     Immunization History  Administered Date(s) Administered  . Tdap 10/02/2016    Health Maintenance  Topic Date Due  . Hepatitis C Screening  Never done  . COVID-19 Vaccine (1) Never done  . INFLUENZA VACCINE  04/26/2020 (Originally 08/28/2019)  . TETANUS/TDAP  10/03/2026  . HIV Screening  Completed    Discussed health benefits of physical activity, and encouraged him to engage in regular exercise appropriate for his age and condition.  Problem List Items Addressed This Visit      Cardiovascular and Mediastinum   Essential hypertension    Well controlled Continue current medications Recheck metabolic panel F/u in 6 months       Relevant Orders   Comprehensive Metabolic Panel (CMET)     Other   Morbid obesity (HCC)    BMI >35 with HTN and HLD Discussed importance of healthy weight management Discussed diet and exercise       Relevant Orders   Lipid panel   Comprehensive Metabolic Panel (CMET)   Hemoglobin A1c   Prediabetes    Recommend low carb diet Recheck A1c      Relevant Orders   Hemoglobin A1c   Mixed hyperlipidemia    Reviewed last lipid panel Not currently on a statin Recheck FLP and CMP Discussed diet and exercise      Relevant Orders   Lipid panel   Comprehensive Metabolic Panel (CMET)    Other Visit Diagnoses    Encounter for annual physical exam    -  Primary   Relevant Orders   Lipid panel    Comprehensive Metabolic Panel (CMET)   Hemoglobin A1c   Need for hepatitis C screening test       Relevant Orders   Hepatitis C antibody       Return in about 6 months (around 04/05/2020) for chronic disease f/u.     I, Jordan Latch, MD, have reviewed all documentation for this visit. The documentation on 10/07/19 for the exam, diagnosis, procedures, and orders are all accurate and complete.   Jenaro Souder, Marzella Schlein, MD, MPH [facility]

## 2019-10-10 DIAGNOSIS — E782 Mixed hyperlipidemia: Secondary | ICD-10-CM | POA: Diagnosis not present

## 2019-10-10 DIAGNOSIS — R7303 Prediabetes: Secondary | ICD-10-CM | POA: Diagnosis not present

## 2019-10-10 DIAGNOSIS — Z0000 Encounter for general adult medical examination without abnormal findings: Secondary | ICD-10-CM | POA: Diagnosis not present

## 2019-10-10 DIAGNOSIS — Z1159 Encounter for screening for other viral diseases: Secondary | ICD-10-CM | POA: Diagnosis not present

## 2019-10-10 DIAGNOSIS — I10 Essential (primary) hypertension: Secondary | ICD-10-CM | POA: Diagnosis not present

## 2019-10-11 ENCOUNTER — Telehealth: Payer: Self-pay

## 2019-10-11 ENCOUNTER — Encounter: Payer: Self-pay | Admitting: Family Medicine

## 2019-10-11 LAB — COMPREHENSIVE METABOLIC PANEL
ALT: 25 IU/L (ref 0–44)
AST: 17 IU/L (ref 0–40)
Albumin/Globulin Ratio: 1.6 (ref 1.2–2.2)
Albumin: 4.5 g/dL (ref 4.0–5.0)
Alkaline Phosphatase: 97 IU/L (ref 44–121)
BUN/Creatinine Ratio: 15 (ref 9–20)
BUN: 16 mg/dL (ref 6–24)
Bilirubin Total: 0.4 mg/dL (ref 0.0–1.2)
CO2: 26 mmol/L (ref 20–29)
Calcium: 9.5 mg/dL (ref 8.7–10.2)
Chloride: 96 mmol/L (ref 96–106)
Creatinine, Ser: 1.09 mg/dL (ref 0.76–1.27)
GFR calc Af Amer: 98 mL/min/{1.73_m2} (ref >59)
GFR calc non Af Amer: 84 mL/min/{1.73_m2} (ref >59)
Globulin, Total: 2.9 g/dL (ref 1.5–4.5)
Glucose: 90 mg/dL (ref 65–99)
Potassium: 3.2 mmol/L — ABNORMAL LOW (ref 3.5–5.2)
Sodium: 138 mmol/L (ref 134–144)
Total Protein: 7.4 g/dL (ref 6.0–8.5)

## 2019-10-11 LAB — HEPATITIS C ANTIBODY: Hep C Virus Ab: <0.1 s/co ratio (ref 0.0–0.9)

## 2019-10-11 LAB — HEMOGLOBIN A1C
Est. average glucose Bld gHb Est-mCnc: 137 mg/dL
Hgb A1c MFr Bld: 6.4 % — ABNORMAL HIGH (ref 4.8–5.6)

## 2019-10-11 NOTE — Telephone Encounter (Signed)
-----   Message from Erasmo Downer, MD sent at 10/11/2019  8:11 AM EDT ----- Normal labs, except prediabetes and low potassium.  Increase potassium in diet and recheck BMP in 2-4 weeks.  Lipid panel pending.

## 2019-10-11 NOTE — Telephone Encounter (Signed)
Written by Jordan Downer, Jordan Donovan on 10/11/2019 8:11 AM EDT Seen by patient Jordan Donovan on 10/11/2019 8:21 AM

## 2019-10-11 NOTE — Telephone Encounter (Signed)
Copied from CRM [id number]. Topic: General - Other >> Oct 11, 2019  2:54 PM Marylen Ponto wrote: Reason for CRM: Pt stated he did not see the lab results for cholesterol. Pt requests call back with an update on the lab results for cholesterol.

## 2019-10-11 NOTE — Telephone Encounter (Signed)
Called [facility] to add test. sd

## 2019-10-12 LAB — LIPID PANEL W/O CHOL/HDL RATIO
Cholesterol, Total: 213 mg/dL — ABNORMAL HIGH (ref 100–199)
HDL: 54 mg/dL (ref >39)
LDL Chol Calc (NIH): 146 mg/dL — ABNORMAL HIGH (ref 0–99)
Triglycerides: 74 mg/dL (ref 0–149)
VLDL Cholesterol Cal: 13 mg/dL (ref 5–40)

## 2019-10-12 LAB — SPECIMEN STATUS REPORT

## 2019-10-13 ENCOUNTER — Telehealth: Payer: Self-pay

## 2019-10-13 NOTE — Telephone Encounter (Signed)
Pt advised.   Thanks,   -Violett Hobbs  

## 2019-10-13 NOTE — Telephone Encounter (Signed)
Please abstract vaccine and let him know how the lab didn't draw the cholesterol and we've requested it to be added on. Thanks

## 2019-10-13 NOTE — Telephone Encounter (Signed)
-----   Message from Erasmo Downer, MD sent at 10/13/2019  3:45 PM EDT ----- Cholesterol remains slightly elevated.  The 10-year ASCVD (heart disease and stroke) risk score Denman George DC Jr., et al., 2013) is: 4.8% which is low, so no medication needed at this time, but I do recommend diet low in saturated fat and regular exercise - 30 min at least 5 times per week

## 2020-02-10 DIAGNOSIS — Z1152 Encounter for screening for COVID-19: Secondary | ICD-10-CM | POA: Diagnosis not present

## 2020-03-12 ENCOUNTER — Encounter: Payer: Self-pay | Admitting: Family Medicine

## 2020-03-12 ENCOUNTER — Other Ambulatory Visit: Payer: Self-pay

## 2020-03-12 ENCOUNTER — Ambulatory Visit: Payer: BC Managed Care – PPO | Admitting: Family Medicine

## 2020-03-12 VITALS — BP 129/89 | HR 85 | Temp 98.6°F | Ht 76.0 in | Wt 322.1 lb

## 2020-03-12 DIAGNOSIS — M5442 Lumbago with sciatica, left side: Secondary | ICD-10-CM

## 2020-03-12 DIAGNOSIS — G8929 Other chronic pain: Secondary | ICD-10-CM

## 2020-03-12 MED ORDER — GABAPENTIN 300 MG PO CAPS: 300.0000 mg | ORAL_CAPSULE | Freq: Three times a day (TID) | ORAL | 3 refills | Status: DC

## 2020-03-12 NOTE — Progress Notes (Signed)
Established patient visit   Patient: Jordan Donovan   DOB: 1980-05-26   40 y.o. Male  MRN: [id number] Visit Date: 03/12/2020  Today's healthcare provider: Shirlee Latch, MD   Chief Complaint  Patient presents with  . Back Pain   Subjective    HPI HPI    Back Pain    This is a chronic problem.  There was not an injury that may have caused the pain.  Recent episode started more than a year ago.  The problem has been gradually worsening since onset.  Pain is lumbar spine.  The pain radiates to left foot, left knee, left thigh, right foot, right knee and right thigh.  The quality of pain is described as stabbing.  Severity of the pain is severe.  Pain occurs constantly.  The symptoms are aggravated by bending and stress.  Symptoms are relieved by position (Fetal position ).  Treatments: NSAIDs and chiropractic treatments.  Treatment provided mild relief.  Abdominal Pain: Absent.  Bowel incontinence: Absent.  Chest pain: Absent.  Dysuria: Absent.  Fever: Absent.  Headaches: Absent.  Joint pains: Present.  Weakness in leg: Absent.  Pelvic pain: Absent.  Tingling in lower extremities: Present.  Urinary incontinence: Absent.  Weight loss: Absent.       Last edited by Paticia Stack, CMA on 03/12/2020  1:50 PM. (History)      Ongoing since age 54. Had EMG/NCS with no answers.   MRI L spine in 2008 showed L4-L5 disc degeneration without significant protrusion or spinal stenosis.  Getting worse since that time.  Occasional radiation down L leg with numbness. Strength is intact.  Taking Naproxen which helps mildly.   Social History   Tobacco Use  . Smoking status: Former Smoker    Types: Cigarettes    Quit date: 01/27/2005    Years since quitting: 15.1  . Smokeless tobacco: Never Used  Substance Use Topics  . Alcohol use: No    Alcohol/week: 0.0 standard drinks  . Drug use: Never       Medications: Outpatient Medications Prior to Visit  Medication Sig  . amLODipine  (NORVASC) 10 MG tablet TAKE 1 TABLET(10 MG) BY MOUTH DAILY  . chlorthalidone (HYGROTON) 25 MG tablet TAKE 1 TABLET(25 MG) BY MOUTH DAILY  . naproxen (NAPROSYN) 500 MG tablet Take 500 mg by mouth 2 (two) times daily with a meal.   No facility-administered medications prior to visit.    Review of Systems  Constitutional: Negative.   HENT: Negative.   Eyes: Negative.   Respiratory: Negative.   Cardiovascular: Negative.   Gastrointestinal: Negative.   Endocrine: Negative.   Genitourinary: Negative.   Musculoskeletal: Positive for arthralgias, back pain and joint swelling.  Skin: Negative.   Allergic/Immunologic: Negative.   Neurological: Negative.   Hematological: Negative.   Psychiatric/Behavioral: Negative.        Objective    BP 129/89 (BP Location: Left Arm, Patient Position: Sitting, Cuff Size: Large)   Pulse 85   Temp 98.6 F (37 C) (Oral)   Ht 6\' 4"  (1.93 m)   Wt (!) 322 lb 1.6 oz (146.1 kg)   SpO2 99%   BMI 39.21 kg/m     Physical Exam Vitals reviewed.  Constitutional:      General: He is not in acute distress.    Appearance: Normal appearance. He is not diaphoretic.  HENT:     Head: Normocephalic and atraumatic.  Eyes:     General: No scleral icterus.  Conjunctiva/sclera: Conjunctivae normal.  Cardiovascular:     Rate and Rhythm: Normal rate and regular rhythm.     Pulses: Normal pulses.     Heart sounds: Normal heart sounds. No murmur heard.   Pulmonary:     Effort: Pulmonary effort is normal. No respiratory distress.     Breath sounds: Normal breath sounds. No wheezing or rhonchi.  Abdominal:     General: There is no distension.     Palpations: Abdomen is soft.     Tenderness: There is no abdominal tenderness.  Musculoskeletal:     Cervical back: Neck supple.     Right lower leg: No edema.     Left lower leg: No edema.     Comments: Back: No midline TTP over spinous processes.  No SI joint tenderness to palpation.  Strength and sensation  intact in lower extremities.  Negative straight leg raise bilaterally.  Lymphadenopathy:     Cervical: No cervical adenopathy.  Skin:    General: Skin is warm and dry.     Capillary Refill: Capillary refill takes less than 2 seconds.     Findings: No rash.  Neurological:     Mental Status: He is alert and oriented to person, place, and time.     Cranial Nerves: No cranial nerve deficit.  Psychiatric:        Mood and Affect: Mood normal.        Behavior: Behavior normal.       No results found for any visits on 03/12/20.  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      Total time spent on today's visit was greater than 30 minutes, including both face-to-face time and nonface-to-face time personally spent on review of chart (labs and imaging), discussing labs and goals, discussing further work-up, treatment options, referrals to specialist if needed, reviewing outside records of pertinent, answering patient's questions, and coordinating care.    I, Shirlee Latch, MD, have reviewed all documentation for this visit. The documentation on 03/12/20 for the exam,  diagnosis, procedures, and orders are all accurate and complete.   Yohance Hathorne, Marzella Schlein, MD, MPH [facility]

## 2020-03-12 NOTE — Assessment & Plan Note (Signed)
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

## 2020-03-12 NOTE — Patient Instructions (Signed)

## 2020-03-15 ENCOUNTER — Telehealth: Payer: Self-pay

## 2020-03-15 NOTE — Telephone Encounter (Signed)
It was completed 2/14 and given to Alroy Dust to submit

## 2020-03-15 NOTE — Telephone Encounter (Signed)
Please advise 

## 2020-03-15 NOTE — Telephone Encounter (Signed)
Copied from CRM [id number]. Topic: General - Other >> Mar 15, 2020  1:34 PM Pawlus, Maxine Glenn A wrote: Reason for CRM: PT was calling and wanted to know the status of his FMLA paperwork. PT stated it was supposed to be done by Monday 2/14 but has not received anything yet. Please advise.

## 2020-03-16 NOTE — Telephone Encounter (Signed)
Pt advised forms were completed and faxed on 03/12/2020 and forms are already in pt's chart under media. TNP

## 2020-04-05 ENCOUNTER — Other Ambulatory Visit: Payer: Self-pay

## 2020-04-05 ENCOUNTER — Ambulatory Visit: Payer: BC Managed Care – PPO | Admitting: Family Medicine

## 2020-04-05 ENCOUNTER — Encounter: Payer: Self-pay | Admitting: Family Medicine

## 2020-04-05 VITALS — BP 120/88 | HR 85 | Temp 98.7°F | Resp 16 | Wt 313.7 lb

## 2020-04-05 DIAGNOSIS — R7303 Prediabetes: Secondary | ICD-10-CM

## 2020-04-05 DIAGNOSIS — E782 Mixed hyperlipidemia: Secondary | ICD-10-CM

## 2020-04-05 DIAGNOSIS — M5442 Lumbago with sciatica, left side: Secondary | ICD-10-CM

## 2020-04-05 DIAGNOSIS — I10 Essential (primary) hypertension: Secondary | ICD-10-CM | POA: Diagnosis not present

## 2020-04-05 DIAGNOSIS — G8929 Other chronic pain: Secondary | ICD-10-CM

## 2020-04-05 NOTE — Assessment & Plan Note (Signed)
Well controlled Continue current medications Recheck metabolic panel F/u in 6 months  

## 2020-04-05 NOTE — Assessment & Plan Note (Signed)
Recommend low carb diet °Recheck A1c  °

## 2020-04-05 NOTE — Assessment & Plan Note (Signed)
Significant improvement since starting gabapentin - will continue at current dose

## 2020-04-05 NOTE — Assessment & Plan Note (Signed)
Congratulated on weight loss ?Discussed importance of healthy weight management ?Discussed diet and exercise  ?

## 2020-04-05 NOTE — Progress Notes (Signed)
Established patient visit   Patient: Jordan Donovan   DOB: December 14, 1980   40 y.o. Male  MRN: [id number] Visit Date: 04/05/2020  Today's healthcare provider: Shirlee Latch, MD   Chief Complaint  Patient presents with  . Hypertension   Subjective    HPI  Hypertension, follow-up  BP Readings from Last 3 Encounters:  04/05/20 120/88  03/12/20 129/89  10/07/19 124/80   Wt Readings from Last 3 Encounters:  04/05/20 (!) 313 lb 11.2 oz (142.3 kg)  03/12/20 (!) 322 lb 1.6 oz (146.1 kg)  10/07/19 (!) 320 lb (145.2 kg)     He was last seen for hypertension 6 months ago.  BP at that visit was 124/ 80. Management since that visit includes continuing same medications.  He reports excellent compliance with treatment. He is not having side effects.  He is following a Low Sodium diet. He is exercising. He does not smoke.  Use of agents associated with hypertension: none.   Outside blood pressures are stable. Symptoms: No chest pain No chest pressure  No palpitations No syncope  No dyspnea No orthopnea  No paroxysmal nocturnal dyspnea No lower extremity edema   Pertinent labs: Lab Results  Component Value Date   CHOL 213 (H) 10/10/2019   HDL 54 10/10/2019   LDLCALC 146 (H) 10/10/2019   TRIG 74 10/10/2019   CHOLHDL 4.5 10/06/2018   Lab Results  Component Value Date   NA 138 10/10/2019   K 3.2 (L) 10/10/2019   CREATININE 1.09 10/10/2019   GFRNONAA 84 10/10/2019   GFRAA 98 10/10/2019   GLUCOSE 90 10/10/2019     The 10-year ASCVD risk score Denman George DC Jr., et al., 2013) is: 4.5%   ---------------------------------------------------------------------------------------------------  Prediabetes, Follow-up  Lab Results  Component Value Date   HGBA1C 6.4 (H) 10/10/2019   HGBA1C 6.1 (H) 10/06/2018   GLUCOSE 90 10/10/2019   GLUCOSE 81 04/06/2019   GLUCOSE 81 10/06/2018    Last seen for for this 6 months ago.  Management since that visit includes  recommending low carb diet. Current symptoms include none and have been stable.  Prior visit with dietician: no Current diet: in general, a "healthy" diet   Current exercise: walking  Pertinent Labs:    Component Value Date/Time   CHOL 213 (H) 10/10/2019 1340   TRIG 74 10/10/2019 1340   CHOLHDL 4.5 10/06/2018 1527   CHOLHDL 3.8 10/02/2016 0937   CREATININE 1.09 10/10/2019 1340   CREATININE 1.15 10/02/2016 0937    Wt Readings from Last 3 Encounters:  04/05/20 (!) 313 lb 11.2 oz (142.3 kg)  03/12/20 (!) 322 lb 1.6 oz (146.1 kg)  10/07/19 (!) 320 lb (145.2 kg)    -----------------------------------------------------------------------------------------  .  Patient Active Problem List   Diagnosis Date Noted  . Chronic bilateral low back pain with left-sided sciatica 03/12/2020  . Prediabetes 10/07/2019  . Mixed hyperlipidemia 10/07/2019  . Morbid obesity (HCC) 04/06/2019  . Essential hypertension 10/16/2016  . Allergic rhinitis 06/10/2007  . DDD (degenerative disc disease), lumbar 03/04/2007   Social History   Tobacco Use  . Smoking status: Former Smoker    Types: Cigarettes    Quit date: 01/27/2005    Years since quitting: 15.1  . Smokeless tobacco: Never Used  Substance Use Topics  . Alcohol use: No    Alcohol/week: 0.0 standard drinks  . Drug use: Never   No Known Allergies     Medications: Outpatient Medications Prior to Visit  Medication  Sig  . amLODipine (NORVASC) 10 MG tablet TAKE 1 TABLET(10 MG) BY MOUTH DAILY  . chlorthalidone (HYGROTON) 25 MG tablet TAKE 1 TABLET(25 MG) BY MOUTH DAILY  . gabapentin (NEURONTIN) 300 MG capsule Take 1 capsule (300 mg total) by mouth 3 (three) times daily.  . naproxen (NAPROSYN) 500 MG tablet Take 500 mg by mouth 2 (two) times daily with a meal.   No facility-administered medications prior to visit.    Review of Systems  Respiratory: Negative.   Cardiovascular: Negative.   Neurological: Negative.    Psychiatric/Behavioral: Negative.     Last CBC Lab Results  Component Value Date   WBC 14.0 (H) 01/31/2018   HGB 14.5 01/31/2018   HCT 44.7 01/31/2018   MCV 84.0 01/31/2018   MCH 27.3 01/31/2018   RDW 13.8 01/31/2018   PLT 362 01/31/2018   Last metabolic panel Lab Results  Component Value Date   GLUCOSE 90 10/10/2019   NA 138 10/10/2019   K 3.2 (L) 10/10/2019   CL 96 10/10/2019   CO2 26 10/10/2019   BUN 16 10/10/2019   CREATININE 1.09 10/10/2019   GFRNONAA 84 10/10/2019   GFRAA 98 10/10/2019   CALCIUM 9.5 10/10/2019   PROT 7.4 10/10/2019   ALBUMIN 4.5 10/10/2019   LABGLOB 2.9 10/10/2019   AGRATIO 1.6 10/10/2019   BILITOT 0.4 10/10/2019   ALKPHOS 97 10/10/2019   AST 17 10/10/2019   ALT 25 10/10/2019   ANIONGAP 11 01/31/2018   Last lipids Lab Results  Component Value Date   CHOL 213 (H) 10/10/2019   HDL 54 10/10/2019   LDLCALC 146 (H) 10/10/2019   TRIG 74 10/10/2019   CHOLHDL 4.5 10/06/2018       Objective    BP 120/88 (BP Location: Left Arm, Patient Position: Sitting, Cuff Size: Large)   Pulse 85   Temp 98.7 F (37.1 C) (Oral)   Resp 16   Wt (!) 313 lb 11.2 oz (142.3 kg)   SpO2 98%   BMI 38.18 kg/m  BP Readings from Last 3 Encounters:  04/05/20 120/88  03/12/20 129/89  10/07/19 124/80   Wt Readings from Last 3 Encounters:  04/05/20 (!) 313 lb 11.2 oz (142.3 kg)  03/12/20 (!) 322 lb 1.6 oz (146.1 kg)  10/07/19 (!) 320 lb (145.2 kg)       Physical Exam Vitals reviewed.  Constitutional:      General: He is not in acute distress.    Appearance: Normal appearance. He is not diaphoretic.  HENT:     Head: Normocephalic and atraumatic.  Eyes:     General: No scleral icterus.    Conjunctiva/sclera: Conjunctivae normal.  Cardiovascular:     Rate and Rhythm: Normal rate and regular rhythm.     Pulses: Normal pulses.     Heart sounds: Normal heart sounds. No murmur heard.   Pulmonary:     Effort: Pulmonary effort is normal. No respiratory  distress.     Breath sounds: Normal breath sounds. No wheezing or rhonchi.  Musculoskeletal:     Cervical back: Neck supple.     Right lower leg: No edema.     Left lower leg: No edema.  Lymphadenopathy:     Cervical: No cervical adenopathy.  Skin:    General: Skin is warm and dry.     Capillary Refill: Capillary refill takes less than 2 seconds.     Findings: No rash.  Neurological:     Mental Status: He is alert and oriented to  person, place, and time.     Cranial Nerves: No cranial nerve deficit.  Psychiatric:        Mood and Affect: Mood normal.        Behavior: Behavior normal.       No results found for any visits on 04/05/20.  Assessment & Plan     Problem List Items Addressed This Visit      Cardiovascular and Mediastinum   Essential hypertension - Primary    Well controlled Continue current medications Recheck metabolic panel F/u in 6 months       Relevant Orders   Comprehensive metabolic panel     Nervous and Auditory   Chronic bilateral low back pain with left-sided sciatica    Significant improvement since starting gabapentin - will continue at current dose        Other   Morbid obesity (HCC)    Congratulated on weight loss Discussed importance of healthy weight management Discussed diet and exercise       Prediabetes    Recommend low carb diet Recheck A1c      Relevant Orders   Hemoglobin A1c   Mixed hyperlipidemia    Reviewed last lipid panel Not currently on a statin Recheck FLP and CMP Discussed diet and exercise       Relevant Orders   Comprehensive metabolic panel   Lipid panel       Return in about 6 months (around 10/06/2020) for CPE.      I, Shirlee Latch, MD, have reviewed all documentation for this visit. The documentation on 04/05/20 for the exam, diagnosis, procedures, and orders are all accurate and complete.   Bacigalupo, Marzella Schlein, MD, MPH [facility]

## 2020-04-05 NOTE — Assessment & Plan Note (Signed)
Reviewed last lipid panel Not currently on a statin Recheck FLP and CMP Discussed diet and exercise  

## 2020-04-06 LAB — COMPREHENSIVE METABOLIC PANEL
ALT: 25 IU/L (ref 0–44)
AST: 17 IU/L (ref 0–40)
Albumin/Globulin Ratio: 1.6 (ref 1.2–2.2)
Albumin: 4.6 g/dL (ref 4.0–5.0)
Alkaline Phosphatase: 105 IU/L (ref 44–121)
BUN/Creatinine Ratio: 12 (ref 9–20)
BUN: 15 mg/dL (ref 6–24)
Bilirubin Total: 0.4 mg/dL (ref 0.0–1.2)
CO2: 24 mmol/L (ref 20–29)
Calcium: 9.7 mg/dL (ref 8.7–10.2)
Chloride: 98 mmol/L (ref 96–106)
Creatinine, Ser: 1.21 mg/dL (ref 0.76–1.27)
Globulin, Total: 2.8 g/dL (ref 1.5–4.5)
Glucose: 93 mg/dL (ref 65–99)
Potassium: 3.6 mmol/L (ref 3.5–5.2)
Sodium: 143 mmol/L (ref 134–144)
Total Protein: 7.4 g/dL (ref 6.0–8.5)
eGFR: 78 mL/min/{1.73_m2} (ref >59)

## 2020-04-06 LAB — LIPID PANEL
Chol/HDL Ratio: 4.1 ratio (ref 0.0–5.0)
Cholesterol, Total: 224 mg/dL — ABNORMAL HIGH (ref 100–199)
HDL: 54 mg/dL (ref >39)
LDL Chol Calc (NIH): 151 mg/dL — ABNORMAL HIGH (ref 0–99)
Triglycerides: 105 mg/dL (ref 0–149)
VLDL Cholesterol Cal: 19 mg/dL (ref 5–40)

## 2020-04-06 LAB — HEMOGLOBIN A1C
Est. average glucose Bld gHb Est-mCnc: 131 mg/dL
Hgb A1c MFr Bld: 6.2 % — ABNORMAL HIGH (ref 4.8–5.6)

## 2020-05-01 DIAGNOSIS — M4726 Other spondylosis with radiculopathy, lumbar region: Secondary | ICD-10-CM | POA: Diagnosis not present

## 2020-05-09 DIAGNOSIS — M4726 Other spondylosis with radiculopathy, lumbar region: Secondary | ICD-10-CM | POA: Diagnosis not present

## 2020-05-16 DIAGNOSIS — M4726 Other spondylosis with radiculopathy, lumbar region: Secondary | ICD-10-CM | POA: Diagnosis not present

## 2020-05-22 DIAGNOSIS — M4726 Other spondylosis with radiculopathy, lumbar region: Secondary | ICD-10-CM | POA: Diagnosis not present

## 2020-05-23 DIAGNOSIS — H40003 Preglaucoma, unspecified, bilateral: Secondary | ICD-10-CM | POA: Diagnosis not present

## 2020-05-29 DIAGNOSIS — M4726 Other spondylosis with radiculopathy, lumbar region: Secondary | ICD-10-CM | POA: Diagnosis not present

## 2020-06-11 ENCOUNTER — Telehealth: Payer: Self-pay | Admitting: Family Medicine

## 2020-06-11 DIAGNOSIS — I10 Essential (primary) hypertension: Secondary | ICD-10-CM

## 2020-06-11 MED ORDER — CHLORTHALIDONE 25 MG PO TABS: ORAL_TABLET | ORAL | 3 refills | Status: DC

## 2020-06-11 NOTE — Telephone Encounter (Signed)
[facility] faxed refill request for the following medications:  1. chlorthalidone (HYGROTON) 25 MG tablet  Last Rx: 04/06/19 LOV: 04/05/20 Please advise. Thanks TNP

## 2020-06-11 NOTE — Telephone Encounter (Signed)
Will send in for refill. KW

## 2020-06-20 DIAGNOSIS — M4726 Other spondylosis with radiculopathy, lumbar region: Secondary | ICD-10-CM | POA: Diagnosis not present

## 2020-06-26 DIAGNOSIS — M4726 Other spondylosis with radiculopathy, lumbar region: Secondary | ICD-10-CM | POA: Diagnosis not present

## 2020-07-03 ENCOUNTER — Telehealth: Payer: Self-pay | Admitting: Family Medicine

## 2020-07-03 DIAGNOSIS — I10 Essential (primary) hypertension: Secondary | ICD-10-CM

## 2020-07-03 MED ORDER — AMLODIPINE BESYLATE 10 MG PO TABS: ORAL_TABLET | ORAL | 3 refills | Status: DC

## 2020-07-03 NOTE — Telephone Encounter (Signed)
[facility] faxed refill request for the following medications:  amLODipine (NORVASC) 10 MG tablet  Last Rx: 04/06/19 LOV: 04/05/20 NOV: 10/08/20 Please advise. Thanks TNP

## 2020-07-17 DIAGNOSIS — M4726 Other spondylosis with radiculopathy, lumbar region: Secondary | ICD-10-CM | POA: Diagnosis not present

## 2020-07-31 DIAGNOSIS — M4726 Other spondylosis with radiculopathy, lumbar region: Secondary | ICD-10-CM | POA: Diagnosis not present

## 2020-08-07 DIAGNOSIS — M4726 Other spondylosis with radiculopathy, lumbar region: Secondary | ICD-10-CM | POA: Diagnosis not present

## 2020-08-14 DIAGNOSIS — M4726 Other spondylosis with radiculopathy, lumbar region: Secondary | ICD-10-CM | POA: Diagnosis not present

## 2020-08-21 DIAGNOSIS — M4726 Other spondylosis with radiculopathy, lumbar region: Secondary | ICD-10-CM | POA: Diagnosis not present

## 2020-08-28 DIAGNOSIS — M4726 Other spondylosis with radiculopathy, lumbar region: Secondary | ICD-10-CM | POA: Diagnosis not present

## 2020-10-08 ENCOUNTER — Encounter: Payer: Self-pay | Admitting: Family Medicine

## 2021-01-16 DIAGNOSIS — L910 Hypertrophic scar: Secondary | ICD-10-CM | POA: Diagnosis not present

## 2021-04-02 ENCOUNTER — Encounter (HOSPITAL_BASED_OUTPATIENT_CLINIC_OR_DEPARTMENT_OTHER): Payer: Self-pay

## 2021-04-02 ENCOUNTER — Other Ambulatory Visit: Payer: Self-pay

## 2021-04-02 ENCOUNTER — Emergency Department: Payer: BLUE CROSS/BLUE SHIELD

## 2021-04-02 ENCOUNTER — Emergency Department (HOSPITAL_BASED_OUTPATIENT_CLINIC_OR_DEPARTMENT_OTHER): Payer: BC Managed Care – PPO

## 2021-04-02 DIAGNOSIS — A879 Viral meningitis, unspecified: Principal | ICD-10-CM | POA: Diagnosis present

## 2021-04-02 DIAGNOSIS — Z87891 Personal history of nicotine dependence: Secondary | ICD-10-CM | POA: Diagnosis not present

## 2021-04-02 DIAGNOSIS — Z83438 Family history of other disorder of lipoprotein metabolism and other lipidemia: Secondary | ICD-10-CM | POA: Diagnosis not present

## 2021-04-02 DIAGNOSIS — R6520 Severe sepsis without septic shock: Secondary | ICD-10-CM | POA: Diagnosis not present

## 2021-04-02 DIAGNOSIS — R519 Headache, unspecified: Secondary | ICD-10-CM | POA: Diagnosis not present

## 2021-04-02 DIAGNOSIS — Z791 Long term (current) use of non-steroidal anti-inflammatories (NSAID): Secondary | ICD-10-CM

## 2021-04-02 DIAGNOSIS — I10 Essential (primary) hypertension: Secondary | ICD-10-CM | POA: Diagnosis not present

## 2021-04-02 DIAGNOSIS — N179 Acute kidney failure, unspecified: Secondary | ICD-10-CM | POA: Diagnosis not present

## 2021-04-02 DIAGNOSIS — Z8249 Family history of ischemic heart disease and other diseases of the circulatory system: Secondary | ICD-10-CM

## 2021-04-02 DIAGNOSIS — E782 Mixed hyperlipidemia: Secondary | ICD-10-CM | POA: Diagnosis present

## 2021-04-02 DIAGNOSIS — G4489 Other headache syndrome: Secondary | ICD-10-CM | POA: Diagnosis not present

## 2021-04-02 DIAGNOSIS — E669 Obesity, unspecified: Secondary | ICD-10-CM | POA: Diagnosis present

## 2021-04-02 DIAGNOSIS — R29818 Other symptoms and signs involving the nervous system: Secondary | ICD-10-CM | POA: Diagnosis not present

## 2021-04-02 DIAGNOSIS — R7303 Prediabetes: Secondary | ICD-10-CM | POA: Diagnosis present

## 2021-04-02 DIAGNOSIS — E876 Hypokalemia: Secondary | ICD-10-CM | POA: Diagnosis not present

## 2021-04-02 DIAGNOSIS — A419 Sepsis, unspecified organism: Secondary | ICD-10-CM | POA: Diagnosis not present

## 2021-04-02 DIAGNOSIS — R110 Nausea: Secondary | ICD-10-CM | POA: Diagnosis not present

## 2021-04-02 DIAGNOSIS — R000 Tachycardia, unspecified: Secondary | ICD-10-CM | POA: Diagnosis not present

## 2021-04-02 DIAGNOSIS — N289 Disorder of kidney and ureter, unspecified: Secondary | ICD-10-CM | POA: Diagnosis not present

## 2021-04-02 DIAGNOSIS — Z79899 Other long term (current) drug therapy: Secondary | ICD-10-CM

## 2021-04-02 DIAGNOSIS — Z20822 Contact with and (suspected) exposure to covid-19: Secondary | ICD-10-CM | POA: Diagnosis present

## 2021-04-02 DIAGNOSIS — Z6837 Body mass index (BMI) 37.0-37.9, adult: Secondary | ICD-10-CM

## 2021-04-02 DIAGNOSIS — R836 Abnormal cytological findings in cerebrospinal fluid: Secondary | ICD-10-CM | POA: Diagnosis not present

## 2021-04-02 NOTE — ED Triage Notes (Signed)
Headache since 4 am this morning. Complains with nausea with multiple episodes of vomiting.  ?

## 2021-04-03 ENCOUNTER — Inpatient Hospital Stay (HOSPITAL_BASED_OUTPATIENT_CLINIC_OR_DEPARTMENT_OTHER)
Admission: EM | Admit: 2021-04-03 | Discharge: 2021-04-05 | DRG: 075 | Disposition: A | Discharge status: Home / Self Care | Payer: BC Managed Care – PPO | Attending: Internal Medicine | Admitting: Internal Medicine

## 2021-04-03 ENCOUNTER — Emergency Department (HOSPITAL_BASED_OUTPATIENT_CLINIC_OR_DEPARTMENT_OTHER): Payer: BC Managed Care – PPO

## 2021-04-03 ENCOUNTER — Encounter (HOSPITAL_COMMUNITY): Payer: Self-pay | Admitting: Internal Medicine

## 2021-04-03 ENCOUNTER — Other Ambulatory Visit: Payer: Self-pay

## 2021-04-03 ENCOUNTER — Inpatient Hospital Stay (HOSPITAL_COMMUNITY): Payer: BC Managed Care – PPO

## 2021-04-03 DIAGNOSIS — E782 Mixed hyperlipidemia: Secondary | ICD-10-CM | POA: Diagnosis present

## 2021-04-03 DIAGNOSIS — I10 Essential (primary) hypertension: Secondary | ICD-10-CM | POA: Diagnosis present

## 2021-04-03 DIAGNOSIS — R29818 Other symptoms and signs involving the nervous system: Secondary | ICD-10-CM | POA: Diagnosis present

## 2021-04-03 DIAGNOSIS — E1169 Type 2 diabetes mellitus with other specified complication: Secondary | ICD-10-CM | POA: Diagnosis present

## 2021-04-03 DIAGNOSIS — A419 Sepsis, unspecified organism: Secondary | ICD-10-CM

## 2021-04-03 DIAGNOSIS — R110 Nausea: Secondary | ICD-10-CM | POA: Diagnosis not present

## 2021-04-03 DIAGNOSIS — N289 Disorder of kidney and ureter, unspecified: Secondary | ICD-10-CM

## 2021-04-03 DIAGNOSIS — E669 Obesity, unspecified: Secondary | ICD-10-CM | POA: Diagnosis present

## 2021-04-03 DIAGNOSIS — E1159 Type 2 diabetes mellitus with other circulatory complications: Secondary | ICD-10-CM | POA: Diagnosis present

## 2021-04-03 DIAGNOSIS — R519 Headache, unspecified: Secondary | ICD-10-CM | POA: Diagnosis present

## 2021-04-03 DIAGNOSIS — R509 Fever, unspecified: Secondary | ICD-10-CM

## 2021-04-03 DIAGNOSIS — E876 Hypokalemia: Secondary | ICD-10-CM | POA: Diagnosis present

## 2021-04-03 DIAGNOSIS — G039 Meningitis, unspecified: Secondary | ICD-10-CM

## 2021-04-03 HISTORY — DX: Essential (primary) hypertension: I10

## 2021-04-03 HISTORY — DX: Mixed hyperlipidemia: E78.2

## 2021-04-03 HISTORY — DX: Obesity, unspecified: E66.9

## 2021-04-03 LAB — APTT: aPTT: 25 seconds (ref 24–36)

## 2021-04-03 LAB — COMPREHENSIVE METABOLIC PANEL
ALT: 25 U/L (ref 0–44)
AST: 21 U/L (ref 15–41)
Albumin: 4.2 g/dL (ref 3.5–5.0)
Alkaline Phosphatase: 72 U/L (ref 38–126)
Anion gap: 13 (ref 5–15)
BUN: 15 mg/dL (ref 6–20)
CO2: 26 mmol/L (ref 22–32)
Calcium: 9.6 mg/dL (ref 8.9–10.3)
Chloride: 101 mmol/L (ref 98–111)
Creatinine, Ser: 1.33 mg/dL — ABNORMAL HIGH (ref 0.61–1.24)
GFR, Estimated: >60 mL/min (ref >60)
Glucose, Bld: 143 mg/dL — ABNORMAL HIGH (ref 70–99)
Potassium: 3.3 mmol/L — ABNORMAL LOW (ref 3.5–5.1)
Sodium: 140 mmol/L (ref 135–145)
Total Bilirubin: 0.3 mg/dL (ref 0.3–1.2)
Total Protein: 7.2 g/dL (ref 6.5–8.1)

## 2021-04-03 LAB — CBC WITH DIFFERENTIAL/PLATELET
Abs Immature Granulocytes: 0.08 10*3/uL — ABNORMAL HIGH (ref 0.00–0.07)
Basophils Absolute: 0.1 10*3/uL (ref 0.0–0.1)
Basophils Relative: 1 %
Eosinophils Absolute: 0 10*3/uL (ref 0.0–0.5)
Eosinophils Relative: 0 %
HCT: 43.5 % (ref 39.0–52.0)
Hemoglobin: 13.8 g/dL (ref 13.0–17.0)
Immature Granulocytes: 1 %
Lymphocytes Relative: 9 %
Lymphs Abs: 1.5 10*3/uL (ref 0.7–4.0)
MCH: 26.8 pg (ref 26.0–34.0)
MCHC: 31.7 g/dL (ref 30.0–36.0)
MCV: 84.6 fL (ref 80.0–100.0)
Monocytes Absolute: 0.8 10*3/uL (ref 0.1–1.0)
Monocytes Relative: 5 %
Neutro Abs: 14.1 10*3/uL — ABNORMAL HIGH (ref 1.7–7.7)
Neutrophils Relative %: 84 %
Platelets: 385 10*3/uL (ref 150–400)
RBC: 5.14 MIL/uL (ref 4.22–5.81)
RDW: 15.8 % — ABNORMAL HIGH (ref 11.5–15.5)
WBC: 16.6 10*3/uL — ABNORMAL HIGH (ref 4.0–10.5)
nRBC: 0 % (ref 0.0–0.2)

## 2021-04-03 LAB — PROTIME-INR
INR: 1 (ref 0.8–1.2)
Prothrombin Time: 13.5 seconds (ref 11.4–15.2)

## 2021-04-03 LAB — LACTIC ACID, PLASMA
Lactic Acid, Venous: 2 mmol/L (ref 0.5–1.9)
Lactic Acid, Venous: 1.5 mmol/L (ref 0.5–1.9)

## 2021-04-03 LAB — URINALYSIS, ROUTINE W REFLEX MICROSCOPIC
Bilirubin Urine: NEGATIVE
Glucose, UA: NEGATIVE mg/dL
Hgb urine dipstick: NEGATIVE
Ketones, ur: NEGATIVE mg/dL
Leukocytes,Ua: NEGATIVE
Nitrite: NEGATIVE
Specific Gravity, Urine: 1.025 (ref 1.005–1.030)
pH: 7 (ref 5.0–8.0)

## 2021-04-03 LAB — CSF CELL COUNT WITH DIFFERENTIAL
Eosinophils, CSF: 0 % (ref 0–1)
Lymphs, CSF: 52 % (ref 40–80)
Monocyte-Macrophage-Spinal Fluid: 24 % (ref 15–45)
RBC Count, CSF: 99 /mm3 — ABNORMAL HIGH
Segmented Neutrophils-CSF: 24 % — ABNORMAL HIGH (ref 0–6)
Tube #: 3
WBC, CSF: 576 /mm3 (ref 0–5)

## 2021-04-03 LAB — HIV ANTIBODY (ROUTINE TESTING W REFLEX): HIV Screen 4th Generation wRfx: NONREACTIVE

## 2021-04-03 LAB — PROTEIN AND GLUCOSE, CSF
Glucose, CSF: 64 mg/dL (ref 40–70)
Total  Protein, CSF: 172 mg/dL — ABNORMAL HIGH (ref 15–45)

## 2021-04-03 LAB — RESP PANEL BY RT-PCR (FLU A&B, COVID) ARPGX2
Influenza A by PCR: NEGATIVE
Influenza B by PCR: NEGATIVE
SARS Coronavirus 2 by RT PCR: NEGATIVE

## 2021-04-03 MED ORDER — SODIUM CHLORIDE 0.9 % IV SOLN
2.0000 g | Freq: Two times a day (BID) | INTRAVENOUS | Status: DC
  Administered 2021-04-03 – 2021-04-04 (×2): 2 g via INTRAVENOUS
  Filled 2021-04-03 (×2): qty 20

## 2021-04-03 MED ORDER — LIDOCAINE-EPINEPHRINE (PF) 2 %-1:200000 IJ SOLN
10.0000 mL | Freq: Once | INTRAMUSCULAR | Status: AC
  Administered 2021-04-03: 10 mL via INTRADERMAL
  Filled 2021-04-03: qty 20

## 2021-04-03 MED ORDER — ACETAMINOPHEN 650 MG RE SUPP: 650.0000 mg | Freq: Four times a day (QID) | RECTAL | Status: DC | PRN

## 2021-04-03 MED ORDER — VANCOMYCIN HCL 1500 MG/300ML IV SOLN
1500.0000 mg | Freq: Three times a day (TID) | INTRAVENOUS | Status: DC
  Administered 2021-04-03 – 2021-04-04 (×3): 1500 mg via INTRAVENOUS
  Filled 2021-04-03 (×4): qty 300

## 2021-04-03 MED ORDER — ENOXAPARIN SODIUM 80 MG/0.8ML IJ SOSY
70.0000 mg | PREFILLED_SYRINGE | INTRAMUSCULAR | Status: DC
  Filled 2021-04-03: qty 0.8

## 2021-04-03 MED ORDER — KETOROLAC TROMETHAMINE 15 MG/ML IJ SOLN
15.0000 mg | Freq: Once | INTRAMUSCULAR | Status: AC
  Administered 2021-04-03: 15 mg via INTRAVENOUS
  Filled 2021-04-03: qty 1

## 2021-04-03 MED ORDER — SODIUM CHLORIDE 0.9 % IV SOLN
2.0000 g | Freq: Once | INTRAVENOUS | Status: AC
  Administered 2021-04-03: 2 g via INTRAVENOUS
  Filled 2021-04-03: qty 20

## 2021-04-03 MED ORDER — PROCHLORPERAZINE EDISYLATE 10 MG/2ML IJ SOLN
10.0000 mg | Freq: Once | INTRAMUSCULAR | Status: AC
  Administered 2021-04-03: 10 mg via INTRAVENOUS
  Filled 2021-04-03: qty 2

## 2021-04-03 MED ORDER — SODIUM CHLORIDE 0.9 % IV SOLN
1000.0000 mL | INTRAVENOUS | Status: DC
  Administered 2021-04-03: 1000 mL via INTRAVENOUS

## 2021-04-03 MED ORDER — SODIUM CHLORIDE 0.9% FLUSH
3.0000 mL | Freq: Two times a day (BID) | INTRAVENOUS | Status: DC
  Administered 2021-04-03 – 2021-04-05 (×4): 3 mL via INTRAVENOUS

## 2021-04-03 MED ORDER — SODIUM CHLORIDE 0.9 % IV SOLN
INTRAVENOUS | Status: DC
Start: 2021-04-03 — End: 2021-04-04

## 2021-04-03 MED ORDER — AMLODIPINE BESYLATE 10 MG PO TABS
10.0000 mg | ORAL_TABLET | Freq: Every day | ORAL | Status: DC
Start: 2021-04-03 — End: 2021-04-05
  Administered 2021-04-04 – 2021-04-05 (×2): 10 mg via ORAL
  Filled 2021-04-03 (×2): qty 1

## 2021-04-03 MED ORDER — SODIUM CHLORIDE 0.9 % IV BOLUS (SEPSIS)
1000.0000 mL | Freq: Once | INTRAVENOUS | Status: AC
  Administered 2021-04-03: 1000 mL via INTRAVENOUS

## 2021-04-03 MED ORDER — LORATADINE 10 MG PO TABS
10.0000 mg | ORAL_TABLET | Freq: Every day | ORAL | Status: DC
  Administered 2021-04-04 – 2021-04-05 (×2): 10 mg via ORAL
  Filled 2021-04-03 (×2): qty 1

## 2021-04-03 MED ORDER — HYDRALAZINE HCL 20 MG/ML IJ SOLN: 10.0000 mg | INTRAMUSCULAR | Status: DC | PRN

## 2021-04-03 MED ORDER — DEXAMETHASONE SODIUM PHOSPHATE 10 MG/ML IJ SOLN
10.0000 mg | Freq: Once | INTRAMUSCULAR | Status: AC
  Administered 2021-04-03: 10 mg via INTRAVENOUS
  Filled 2021-04-03: qty 1

## 2021-04-03 MED ORDER — ACETAMINOPHEN 325 MG PO TABS
650.0000 mg | ORAL_TABLET | Freq: Four times a day (QID) | ORAL | Status: DC | PRN
  Administered 2021-04-04: 20:00:00 650 mg via ORAL
  Filled 2021-04-03: qty 2

## 2021-04-03 MED ORDER — ACYCLOVIR SODIUM 50 MG/ML IV SOLN
1000.0000 mg | Freq: Three times a day (TID) | INTRAVENOUS | Status: DC
  Administered 2021-04-03 – 2021-04-04 (×3): 1000 mg via INTRAVENOUS
  Filled 2021-04-03 (×5): qty 20

## 2021-04-03 MED ORDER — ACETAMINOPHEN 325 MG PO TABS
650.0000 mg | ORAL_TABLET | Freq: Once | ORAL | Status: AC
Start: 2021-04-03 — End: 2021-04-03
  Administered 2021-04-03: 650 mg via ORAL
  Filled 2021-04-03: qty 2

## 2021-04-03 MED ORDER — ONDANSETRON HCL 4 MG PO TABS: 4.0000 mg | ORAL_TABLET | Freq: Four times a day (QID) | ORAL | Status: DC | PRN

## 2021-04-03 MED ORDER — BUTALBITAL-APAP-CAFFEINE 50-325-40 MG PO TABS
1.0000 | ORAL_TABLET | Freq: Four times a day (QID) | ORAL | Status: DC | PRN
  Administered 2021-04-03 – 2021-04-05 (×5): 1 via ORAL
  Filled 2021-04-03 (×5): qty 1

## 2021-04-03 MED ORDER — GABAPENTIN 300 MG PO CAPS
300.0000 mg | ORAL_CAPSULE | Freq: Three times a day (TID) | ORAL | Status: DC | PRN
  Administered 2021-04-04: 20:00:00 300 mg via ORAL
  Filled 2021-04-03: qty 1

## 2021-04-03 MED ORDER — ONDANSETRON HCL 4 MG/2ML IJ SOLN
4.0000 mg | Freq: Four times a day (QID) | INTRAMUSCULAR | Status: DC | PRN
Start: 2021-04-03 — End: 2021-04-05

## 2021-04-03 MED ORDER — VANCOMYCIN HCL IN DEXTROSE 1-5 GM/200ML-% IV SOLN
1000.0000 mg | INTRAVENOUS | Status: AC
  Administered 2021-04-03 (×2): 1000 mg via INTRAVENOUS
  Filled 2021-04-03 (×2): qty 200

## 2021-04-03 MED ORDER — POTASSIUM CHLORIDE CRYS ER 20 MEQ PO TBCR: 40.0000 meq | EXTENDED_RELEASE_TABLET | ORAL | Status: DC

## 2021-04-03 MED ORDER — VANCOMYCIN HCL IN DEXTROSE 1-5 GM/200ML-% IV SOLN: 1000.0000 mg | Freq: Once | INTRAVENOUS | Status: DC

## 2021-04-03 MED ORDER — SODIUM CHLORIDE 0.9 % IV BOLUS
1000.0000 mL | Freq: Once | INTRAVENOUS | Status: AC
  Administered 2021-04-03: 1000 mL via INTRAVENOUS

## 2021-04-03 MED ORDER — ALBUTEROL SULFATE (2.5 MG/3ML) 0.083% IN NEBU: 2.5000 mg | INHALATION_SOLUTION | Freq: Four times a day (QID) | RESPIRATORY_TRACT | Status: DC | PRN

## 2021-04-03 MED ORDER — LIDOCAINE HCL (PF) 1 % IJ SOLN
5.0000 mL | Freq: Once | INTRAMUSCULAR | Status: AC
  Administered 2021-04-03: 5 mL
  Filled 2021-04-03: qty 5

## 2021-04-03 NOTE — Assessment & Plan Note (Addendum)
Acute.  Initial potassium 3.3. ?-Given potassium chloride 40 meq p.o. ?-Continue to monitor and replace as needed ?

## 2021-04-03 NOTE — Progress Notes (Signed)
  [facility] will assume care on arrival to accepting facility. Until arrival, care as per EDP. However, [facility] available 24/7 for questions and assistance.   Nursing staff please page [facility] Admits and Consults ([phone]) as soon as the patient arrives to the hospital.  Dawn Kiper, DO [facility]  

## 2021-04-03 NOTE — ED Notes (Signed)
Care link at bedside, report to care link, pt states that he has no need before transfer, pt from dpt.  ?

## 2021-04-03 NOTE — Assessment & Plan Note (Addendum)
Present with complaints of fever elevated up to 101.5 ?F on admission, headache, and neck stiffness.  Also noted to be tachycardic, tachypneic with WBC elevated 16.6, and lactic acid 2 given concern for sepsis.  CT scan of the brain did not note any acute abnormalities.  Based off clinical presentation there was concern for meningitis.  Patient has been empirically started on antibiotics of vancomycin and Rocephin. ?-Admit to a MedSurg bed ?-Follow-up lumbar studies and culture ?-Check respiratory viral panel given recent sick contact ?-Continue empiric antibiotics of vancomycin, Rocephin, and added-on acyclovir ?-Fioricet as needed for headache ?

## 2021-04-03 NOTE — Assessment & Plan Note (Signed)
On admission blood pressures elevated up to 153/86.  Home blood pressure regimen includes Amlodipine 10 mg daily and chlorthalidone 25 mg daily. ?-Continue amlodipine ?-Consider resuming chlorthalidone when medically appropriate ?

## 2021-04-03 NOTE — Assessment & Plan Note (Signed)
BMI 37.95 kg/m?Marland Kitchen ?

## 2021-04-03 NOTE — ED Provider Notes (Signed)
[facility] EMERGENCY DEPT Provider Note  CSN: [id number] Arrival date & time: 04/02/21 2110  Chief Complaint(s) Headache  HPI Jordan Donovan is a 41 y.o. male with past medical history listed below including hypertension, hyperlipidemia and prediabetes here for severe headache that was there when he awoke this morning around 4 AM.  Pain is generalized and gradually worsened throughout the day now involving neck.  Patient reports stiffness in his neck.  Associated nausea with nonbloody nonbilious emesis.  Also endorsing lightheadedness upon standing.  No visual disturbance though he does have photophobia.  No reported fevers at home.  No known sick contacts.  No chest pain or shortness of breath.  No coughing.  No abdominal pain.  No diarrhea.  No urinary symptoms.  The history is provided by the patient.   Past Medical History Past Medical History:  Diagnosis Date   Degenerative disc disease, lumbar    Patient Active Problem List   Diagnosis Date Noted   Chronic bilateral low back pain with left-sided sciatica 03/12/2020   Prediabetes 10/07/2019   Mixed hyperlipidemia 10/07/2019   Morbid obesity ([place]) 04/06/2019   Essential hypertension 10/16/2016   Allergic rhinitis 06/10/2007   DDD (degenerative disc disease), lumbar 03/04/2007   Home Medication(s) Prior to Admission medications   Medication Sig Start Date End Date Taking? Authorizing Provider  amLODipine (NORVASC) 10 MG tablet TAKE 1 TABLET(10 MG) BY MOUTH DAILY 07/03/20   Bacigalupo, Dionne Bucy, MD  chlorthalidone (HYGROTON) 25 MG tablet TAKE 1 TABLET(25 MG) BY MOUTH DAILY 06/11/20   Bacigalupo, Dionne Bucy, MD  gabapentin (NEURONTIN) 300 MG capsule Take 1 capsule (300 mg total) by mouth 3 (three) times daily. 03/12/20   Bacigalupo, Dionne Bucy, MD  naproxen (NAPROSYN) 500 MG tablet Take 500 mg by mouth 2 (two) times daily with a meal.    [provider]                                                                                                                                     Allergies Patient has no known allergies.  Review of Systems Review of Systems As noted in HPI  Physical Exam Vital Signs  I have reviewed the triage vital signs BP (!) 150/79    Pulse (!) 122    Temp 100.1 F (37.8 C) (Oral)    Resp 18    Ht 6\' 5"  (1.956 m)    Wt (!) 145.2 kg    SpO2 99%    BMI 37.95 kg/m   Physical Exam Vitals reviewed.  Constitutional:      General: He is not in acute distress.    Appearance: He is well-developed. He is ill-appearing and diaphoretic.  HENT:     Head: Normocephalic and atraumatic.     Right Ear: Tympanic membrane normal. There is impacted cerumen (pushed past cerumen to see TM). No mastoid tenderness. Tympanic membrane is not injected or  erythematous.     Left Ear: Tympanic membrane normal. There is impacted cerumen (pushed past cerumen to see TM). No mastoid tenderness. Tympanic membrane is not injected or erythematous.     Nose: Nose normal.     Mouth/Throat:     Pharynx: Oropharynx is clear.  Eyes:     General: No scleral icterus.       Right eye: No discharge.        Left eye: No discharge.     Conjunctiva/sclera: Conjunctivae normal.     Pupils: Pupils are equal, round, and reactive to light.  Cardiovascular:     Rate and Rhythm: Regular rhythm. Tachycardia present.     Heart sounds: No murmur heard.   No friction rub. No gallop.  Pulmonary:     Effort: Pulmonary effort is normal. No respiratory distress.     Breath sounds: Normal breath sounds. No stridor. No rales.  Abdominal:     General: There is no distension.     Palpations: Abdomen is soft.     Tenderness: There is no abdominal tenderness.  Musculoskeletal:        General: No tenderness.     Cervical back: Normal range of motion and neck supple.  Skin:    General: Skin is warm.     Findings: No erythema or rash.  Neurological:     Mental Status: He is alert and oriented to person, place, and time.      Comments: Mental Status:  Alert and oriented to person, place, and time.  Attention and concentration normal.  Speech clear.  Recent memory is intact  Cranial Nerves:  II Visual Fields: Intact to confrontation. Visual fields intact. III, IV, VI: Pupils equal and reactive to light and near. Full eye movement without nystagmus  V Facial Sensation: Normal. No weakness of masticatory muscles  VII: No facial weakness or asymmetry  VIII Auditory Acuity: Grossly normal  IX/X: The uvula is midline; the palate elevates symmetrically  XI: Normal sternocleidomastoid and trapezius strength  XII: The tongue is midline. No atrophy or fasciculations.   Motor System: Muscle Strength: 5/5 and symmetric in the upper and lower extremities. No pronation or drift.  Muscle Tone: Tone and muscle bulk are normal in the upper and lower extremities.  Coordination:  No tremor.  Sensation: Intact to light touch Gait: Routine gait normal.      ED Results and Treatments Labs (all labs ordered are listed, but only abnormal results are displayed) Labs Reviewed  LACTIC ACID, PLASMA - Abnormal; Notable for the following components:      Result Value   Lactic Acid, Venous 2.0 (*)    All other components within normal limits  COMPREHENSIVE METABOLIC PANEL - Abnormal; Notable for the following components:   Potassium 3.3 (*)    Glucose, Bld 143 (*)    Creatinine, Ser 1.33 (*)    All other components within normal limits  CBC WITH DIFFERENTIAL/PLATELET - Abnormal; Notable for the following components:   WBC 16.6 (*)    RDW 15.8 (*)    Neutro Abs 14.1 (*)    Abs Immature Granulocytes 0.08 (*)    All other components within normal limits  URINALYSIS, ROUTINE W REFLEX MICROSCOPIC - Abnormal; Notable for the following components:   Protein, ur TRACE (*)    All other components within normal limits  RESP PANEL BY RT-PCR (FLU A&B, COVID) ARPGX2  CSF CULTURE W GRAM STAIN  CULTURE, BLOOD (ROUTINE X 2)   CULTURE, BLOOD (  ROUTINE X 2)  PROTIME-INR  APTT  LACTIC ACID, PLASMA  CSF CELL COUNT WITH DIFFERENTIAL  PROTEIN AND GLUCOSE, CSF                                                                                                                         EKG  EKG Interpretation  Date/Time: 04/03/2021 01:14:55   Ventricular Rate:   115 PR Interval:   149 QRS Duration:  93 QT Interval: 318   QTC Calculation:440   R Axis:   55  Text Interpretation:  Sinus tachycardia. Borderline T wave abnormalities       Radiology CT Head Wo Contrast  Result Date: 04/02/2021 CLINICAL DATA:  Headache EXAM: CT HEAD WITHOUT CONTRAST TECHNIQUE: Contiguous axial images were obtained from the base of the skull through the vertex without intravenous contrast. RADIATION DOSE REDUCTION: This exam was performed according to the departmental dose-optimization program which includes automated exposure control, adjustment of the mA and/or kV according to patient size and/or use of iterative reconstruction technique. COMPARISON:  None. FINDINGS: Brain: No acute territorial infarction, hemorrhage or intracranial mass. The ventricles are nonenlarged. Vascular: No hyperdense vessel or unexpected calcification. Skull: Normal. Negative for fracture or focal lesion. Sinuses/Orbits: No acute finding. Other: None IMPRESSION: Negative non contrasted CT appearance of the brain Electronically Signed   By: Donavan Foil M.D.   On: 04/02/2021 22:08   DG Chest Port 1 View  Result Date: 04/03/2021 CLINICAL DATA:  Headaches and nausea, initial encounter EXAM: PORTABLE CHEST 1 VIEW COMPARISON:  02/22/2018 FINDINGS: The heart size and mediastinal contours are within normal limits. Both lungs are clear. The visualized skeletal structures are unremarkable. IMPRESSION: No active disease. Electronically Signed   By: Inez Catalina M.D.   On: 04/03/2021 02:42    Pertinent labs & imaging results that were available during my care of the patient were  reviewed by me and considered in my medical decision making (see MDM for details).  Medications Ordered in ED Medications  dexamethasone (DECADRON) injection 10 mg (has no administration in time range)  cefTRIAXone (ROCEPHIN) 2 g in sodium chloride 0.9 % 100 mL IVPB (has no administration in time range)  sodium chloride 0.9 % bolus 1,000 mL (has no administration in time range)    Followed by  0.9 %  sodium chloride infusion (has no administration in time range)  vancomycin (VANCOCIN) IVPB 1000 mg/200 mL premix (has no administration in time range)  sodium chloride 0.9 % bolus 1,000 mL (0 mLs Intravenous Stopped 04/03/21 0256)  ketorolac (TORADOL) 15 MG/ML injection 15 mg (15 mg Intravenous Given 04/03/21 0149)  prochlorperazine (COMPAZINE) injection 10 mg (10 mg Intravenous Given 04/03/21 0149)  acetaminophen (TYLENOL) tablet 650 mg (650 mg Oral Given 04/03/21 0237)  lidocaine-EPINEPHrine (XYLOCAINE W/EPI) 2 %-1:200000 (PF) injection 10 mL (10 mLs Intradermal Given 04/03/21 0359)  Procedures .Lumbar Puncture  Date/Time: 04/03/2021 4:26 AM Performed by: Fatima Blank, MD Authorized by: Fatima Blank, MD   Consent:    Consent obtained:  Written   Consent given by:  Patient   Risks discussed:  Bleeding, infection, headache, nerve damage and pain   Alternatives discussed:  No treatment Universal protocol:    Procedure explained and questions answered to patient or proxy's satisfaction: yes     Relevant documents present and verified: yes     Test results available: yes     Site/side marked: yes     Patient identity confirmed:  Arm band Pre-procedure details:    Procedure purpose:  Diagnostic   Preparation: Patient was prepped and draped in usual sterile fashion   Anesthesia:    Anesthesia method:  Local infiltration   Local anesthetic:   Lidocaine 2% WITH epi Procedure details:    Lumbar space:  L4-L5 interspace   Patient position:  L lateral decubitus   Needle gauge:  20   Needle type:  Spinal needle - Quincke tip   Needle length (in): 7.0.   Number of attempts:  3   Fluid appearance:  Bloody Post-procedure details:    Puncture site:  Adhesive bandage applied Comments:     unsuccessful .Critical Care Performed by: Fatima Blank, MD Authorized by: Fatima Blank, MD   Critical care provider statement:    Critical care time (minutes):  65   Critical care time was exclusive of:  Separately billable procedures and treating other patients   Critical care was necessary to treat or prevent imminent or life-threatening deterioration of the following conditions:  Sepsis   Critical care was time spent personally by me on the following activities:  Development of treatment plan with patient or surrogate, discussions with consultants, evaluation of patient's response to treatment, examination of patient, obtaining history from patient or surrogate, review of old charts, re-evaluation of patient's condition, pulse oximetry, ordering and review of radiographic studies, ordering and review of laboratory studies and ordering and performing treatments and interventions   Care discussed with: admitting provider    (including critical care time)  Medical Decision Making / ED Course    Complexity of Problem:  Co-morbidities/SDOH that complicate the patient evaluation/care: As noted above  Additional history obtained: N/A  Patient's presenting problem/concern and DDX listed below: Headache Given patient's severity of headache with nuchal rigidity and now found to be febrile in the emergency department -mostly concerning for meningitis (bacterial versus viral) Lower suspicion for ICH Possible migrainous but less likely given fever No obvious source of infection on exam or with history other than possible  meningeal    Complexity of Data:   Cardiac Monitoring: The patient was maintained on a cardiac monitor.   I personally viewed and interpreted the cardiac monitored which showed an underlying rhythm of sinus tachycardia.  No dysrhythmias or blocks  Laboratory Tests ordered listed below with my independent interpretation: CBC with leukocytosis and left shift.  No anemia. Lactic acid elevated. Mild hypokalemia and AKI.  No other electrolyte derangements. No evidence of biliary obstruction. UA without evidence of infection. COVID/influenza negative.    Imaging Studies ordered listed below with my independent interpretation: CT head without ICH or mass effect Chest x-ray without evidence of pneumonia     ED Course:    Hospitalization Considered:  Yes  Assessment, Intervention, and Reassessment: Headache Found to have fever.  Most concerning for meningitis. LP unsuccessful. Code sepsis initiated and patient  started on empiric antibiotics for possible bacterial meningitis. No other sources noted on work-up. We will need to admit for continued work-up and management. Consult to hospitalist service.  I spoke with Dr. Bridgett Larsson, Who agreed to admit    Final Clinical Impression(s) / ED Diagnoses Final diagnoses:  Bad headache  Fever in adult  Sepsis with acute renal failure without septic shock, due to unspecified organism, unspecified acute renal failure type [facility])           This chart was dictated using voice recognition software.  Despite best efforts to proofread,  errors can occur which can change the documentation meaning.    Fatima Blank, MD 04/03/21 [phone]

## 2021-04-03 NOTE — Sepsis Progress Note (Signed)
Elink following Code Sepsis. 

## 2021-04-03 NOTE — Progress Notes (Signed)
?  Transition of Care (TOC) Screening Note ? ? ?Patient Details  ?Name: Jordan Donovan ?Date of Birth: [birth year] ? ? ?Transition of Care (TOC) CM/SW Contact:    ?Mearl Latin, LCSW ?Phone Number: ?04/03/2021, 8:50 AM ? ? ? ?Transition of Care Department [facility]) has reviewed patient and no TOC needs have been identified at this time. We will continue to monitor patient advancement through interdisciplinary progression rounds. If new patient transition needs arise, please place a TOC consult. ? ? ?

## 2021-04-03 NOTE — Progress Notes (Addendum)
Pharmacy Antibiotic Note ? ?Jordan Donovan is a 41 y.o. male admitted on 04/03/2021 with meningitis.  Pharmacy has been consulted for vancomycin dosing. ? ?WBC 16.6 ?Temp (24hrs), Avg:100.8 ?F (38.2 ?C), Min:100.1 ?F (37.8 ?C), Max:101.5 ?F (38.6 ?C) ? ?Given that we are ruling out meningitis will use traditional dosing for vancomycin. Also given his size he will likely require high dosing, will need to check level at steady state.  ? ?Plan: ?Vancomycin 2000 mg IV loading dose in ED ?Vancomycin 1500 mg IV Q 8 hrs.  ?Consider trough level a steady state ? ?Height: 6\' 5"  (195.6 cm) ?Weight: (!) 145.2 kg (320 lb) ?IBW/kg (Calculated) : 89.1 ? ? ?Recent Labs  ?Lab 04/03/21 ?0202 04/03/21 ?0214  ?WBC 16.6*  --   ?CREATININE 1.33*  --   ?LATICACIDVEN  --  2.0*  ?  ?Estimated Creatinine Clearance: 115.3 mL/min (A) (by C-G formula based on SCr of 1.33 mg/dL (H)).   ? ?No Known Allergies ? ?Antimicrobials this admission: ?Vancomycin 3/8 >> ?Ceftriaxone 3/8 >> ? ?Thank you for allowing pharmacy to be a part of this patient?s care. ? ?Carma Lair, PharmD Candidate [phone] ?04/03/2021 5:15 AM ? ?

## 2021-04-03 NOTE — H&P (Addendum)
?History and Physical  ? ? ?Patient: Jordan Donovan GHW:[id number] DOB: January 17, 1981 ?DOA: 04/03/2021 ?DOS: the patient was seen and examined on 04/03/2021 ?PCP: Erasmo Downer, MD  ?Patient coming from: Transfer from [facility] ? ?Chief Complaint:  ?Chief Complaint  ?Patient presents with  ? Headache  ? ?HPI: Jordan Donovan is a 41 y.o. male with medical history significant of hypertension, hyperlipidemia, and obesity presents with complaints of headache.  Patient reports waking up yesterday morning around 3:30 a.m. with global headache.  He normally does not get headaches and question if symptoms were secondary to him missing taking forgetting to take his blood pressure and allergy medicine.  Once he was able to check his blood pressure he stated that it was normal.  As the day went on pain seemed to radiate down to his neck however which he reported feeling stiff.  He had tried taking Aleve and laying down without improvement in symptoms.  When he got up he reported feeling dizzy like the room was spinning and he would get nauseous and sweaty.  He reported having temperature up to 99.3 ?F at home.  He had 1 episode of vomiting up clear fluid.  Normally he does not get headaches.  Of note his wife brings up the fact that his grandchild had recently gotten over a viral illness in the last week where he had fevers up to 104 ?F.  Denies having any focal weakness, chest pain, palpitations, cough, shortness of breath, diarrhea, or abdominal pain symptoms. ? ?Upon admission to the emergency department patient was noted to be febrile up to 101.5 ?F with tachycardia and tachypnea.  Labs significant for WBC 16.6, potassium 3.3, and lactic acid 2->1.5.  CT scan of the head did not note any acute abnormalities and chest x-ray was also within normal limits.  Influenza and COVID-19 screening negative.  Urinalysis noted trace protein.  Blood cultures have been obtained.  He is given fluid bolus 2 L, Decadron 10 mg IV,  Rocephin, and vancomycin.  Admission requested for need of LP. ? ? ?Review of Systems: As mentioned in the history of present illness. All other systems reviewed and are negative. ?Past Medical History:  ?Diagnosis Date  ? Degenerative disc disease, lumbar   ? Hypertension   ? Mixed hyperlipidemia   ? Obesity (BMI 30-39.9)   ? ?Past Surgical History:  ?Procedure Laterality Date  ? CIRCUMCISION  1995  ? CIRCUMCISION  1995  ? TONSILLECTOMY    ? TONSILLECTOMY AND ADENOIDECTOMY  1998  ? ?Social History:  reports that he quit smoking about 16 years ago. His smoking use included cigarettes. He has never used smokeless tobacco. He reports that he does not drink alcohol and does not use drugs. ? ?No Known Allergies ? ?Family History  ?Problem Relation Age of Onset  ? Healthy Mother   ? Hypertension Mother   ? Healthy Father   ? Healthy Brother   ? Healthy Daughter   ? Healthy Son   ? Healthy Daughter   ? ? ?Prior to Admission medications   ?Medication Sig Start Date End Date Taking? Authorizing Provider  ?amLODipine (NORVASC) 10 MG tablet TAKE 1 TABLET(10 MG) BY MOUTH DAILY ?Patient taking differently: Take 10 mg by mouth daily. 07/03/20  Yes Bacigalupo, Marzella Schlein, MD  ?cetirizine (ZYRTEC) 10 MG tablet Take 10 mg by mouth daily.   Yes [provider]  ?gabapentin (NEURONTIN) 300 MG capsule Take 1 capsule (300 mg total) by mouth 3 (three) times  daily. ?Patient taking differently: Take 300 mg by mouth 3 (three) times daily as needed (pain). 03/12/20  Yes Bacigalupo, Marzella SchleinAngela M, MD  ?Multiple Vitamin (MULTIVITAMIN) tablet Take 1 tablet by mouth daily.   Yes [provider]  ?naproxen (NAPROSYN) 500 MG tablet Take 500 mg by mouth 2 (two) times daily as needed for mild pain.   Yes [provider]  ?chlorthalidone (HYGROTON) 25 MG tablet TAKE 1 TABLET(25 MG) BY MOUTH DAILY ?Patient taking differently: Take 25 mg by mouth daily. 06/11/20   Erasmo DownerBacigalupo, Angela M, MD  ? ? ?Physical Exam: ?Vitals:  ? 04/03/21 0600  04/03/21 0645 04/03/21 0732 04/03/21 0837  ?BP: 115/67 119/65 (!) 151/73 (!) 146/94  ?Pulse: 97 (!) 104 (!) 107 (!) 107  ?Resp: (!) 25 (!) 25 (!) 24 17  ?Temp:   99.4 ?F (37.4 ?C) 100.1 ?F (37.8 ?C)  ?TempSrc:   Oral Oral  ?SpO2: 96% 95% 98% 97%  ?Weight:      ?Height:      ? ?Constitutional: Obese middle-age male currently in no acute distress ?Eyes: PERRL, lids and conjunctivae normal ?ENMT: Mucous membranes are moist. Posterior pharynx clear of any exudate or lesions.    ?Neck: normal and supple at this time ?Respiratory: clear to auscultation bilaterally, no wheezing, no crackles. Normal respiratory effort.    ?Cardiovascular: Tachycardic.  No lower extremity edema. 2+ pedal pulses. No carotid bruits.  ?Abdomen: no tenderness, no masses palpated. No hepatosplenomegaly. Bowel sounds positive.  ?Musculoskeletal: no clubbing / cyanosis. No joint deformity upper and lower extremities. Good ROM, no contractures. Normal muscle tone.  ?Skin: no rashes, lesions, ulcers. No induration ?Neurologic: CN 2-12 grossly intact.  Able to move all extremities ?Psychiatric: Normal judgment and insight. Alert and oriented x 3. Normal mood.  ? ?Data Reviewed: ? ? ?Labs, imaging, and review of records as noted in HPI.  EKG sinus tachycardia at 115 bpm. ? ?Assessment and Plan: ?* Suspected infectious meningitis headache ?Present with complaints of fever elevated up to 101.5 ?F on admission, headache, and neck stiffness.  Also noted to be tachycardic, tachypneic with WBC elevated 16.6, and lactic acid 2 given concern for sepsis.  CT scan of the brain did not note any acute abnormalities.  Based off clinical presentation there was concern for meningitis.  Patient has been empirically started on antibiotics of vancomycin and Rocephin. ?-Admit to a MedSurg bed ?-Follow-up lumbar studies and culture ?-Check respiratory viral panel given recent sick contact ?-Continue empiric antibiotics of vancomycin, Rocephin, and add on  azithromycin ?-Fioricet as needed for headache ? ?Essential hypertension ?On admission blood pressures elevated up to 153/86.  Home blood pressure regimen includes Amlodipine 10 mg daily and chlorthalidone 25 mg daily. ?-Continue amlodipine ?-Consider resuming chlorthalidone when medically appropriate ? ?Renal insufficiency ?Acute.  On admission creatinine 1.33, baseline creatinine previously noted to be 1-1.2.  Patient had received 2 L bolus of IV fluids in the ED. ?-Held chlorthalidone ?-Normal saline IV fluids at 75 mL/h ?-Recheck kidney function in a.m. ? ?Hypokalemia ?Acute.  Initial potassium 3.3. ?-Given potassium chloride 40 meq p.o. ?-Continue to monitor and replace as needed ? ?Obesity (BMI 30-39.9) ?BMI 37.95 kg/m?. ? ? ? ? ?Advance Care Planning:   Code Status: Full Code   ? ?Consults: ID ? ?Family Communication: Wife updated at bedside ? ?Severity of Illness: ?The appropriate patient status for this patient is INPATIENT. Inpatient status is judged to be reasonable and necessary in order to provide the required intensity of service  to ensure the patient's safety. The patient's presenting symptoms, physical exam findings, and initial radiographic and laboratory data in the context of their chronic comorbidities is felt to place them at high risk for further clinical deterioration. Furthermore, it is not anticipated that the patient will be medically stable for discharge from the hospital within 2 midnights of admission.  ? ?* I certify that at the point of admission it is my clinical judgment that the patient will require inpatient hospital care spanning beyond 2 midnights from the point of admission due to high intensity of service, high risk for further deterioration and high frequency of surveillance required.* ? ?Author: ?Clydie Braun, MD ?04/03/2021 12:26 PM ? ?For on call review [email or web address].  ?

## 2021-04-03 NOTE — Plan of Care (Signed)

## 2021-04-03 NOTE — Assessment & Plan Note (Signed)
Acute.  On admission creatinine 1.33, baseline creatinine previously noted to be 1-1.2.  Patient had received 2 L bolus of IV fluids in the ED. ?-Held chlorthalidone ?-Normal saline IV fluids at 75 mL/h ?-Recheck kidney function in a.m. ?

## 2021-04-03 NOTE — ED Notes (Signed)
Lactate 2.0 read back and verified with Mikle Bosworth. ?

## 2021-04-03 NOTE — ED Notes (Signed)
Pt in bed, pt ambulatory to bathroom without assistance, pt reports 3/10 pain, states that he doesn't need anything at this time.  ?

## 2021-04-03 NOTE — Progress Notes (Signed)
Pharmacy Antibiotic Note ? ?Jordan Donovan is a 41 y.o. male admitted on 04/03/2021 with meningitis.  Pharmacy has been consulted for vancomycin dosing. ? ?WBC 16.6 ?Temp (24hrs), Avg:100.8 ?F (38.2 ?C), Min:100.1 ?F (37.8 ?C), Max:101.5 ?F (38.6 ?C) ? ?Given that we are ruling out meningitis will use traditional dosing for vancomycin. Also given his size he will likely require high dosing, will need to check level at steady state.  ? ?LP done 3/8 with increase protein. Empiric acyclovir added. Maintenance fluid on board already to help prevent nephrotoxicity. Continue ceftriaxone 2g IV q12 ? ?Plan: ?Vancomycin 1500 mg IV Q 8 hrs.  ?Consider trough level a steady state ?Ceftriaxone 2g IV q12 ?Acyclovir 1000mg  IV q8 ? ?Height: 6\' 5"  (195.6 cm) ?Weight: (!) 145.2 kg (320 lb) ?IBW/kg (Calculated) : 89.1 ? ? ?Recent Labs  ?Lab 04/03/21 ?0202 04/03/21 ?0214 04/03/21 ?0457  ?WBC 16.6*  --   --   ?CREATININE 1.33*  --   --   ?LATICACIDVEN  --  2.0* 1.5  ? ?  ?Estimated Creatinine Clearance: 115.3 mL/min (A) (by C-G formula based on SCr of 1.33 mg/dL (H)).   ? ?No Known Allergies ? ?Antimicrobials this admission: ?Vancomycin 3/8 >> ?Ceftriaxone 3/8 >> ?Acyclovir 1000mg  IV q8 ? ?5/8, PharmD, BCIDP, AAHIVP, CPP ?Infectious Disease Pharmacist ?04/03/2021 4:13 PM ? ? ?

## 2021-04-04 DIAGNOSIS — E876 Hypokalemia: Secondary | ICD-10-CM | POA: Diagnosis not present

## 2021-04-04 DIAGNOSIS — R29818 Other symptoms and signs involving the nervous system: Secondary | ICD-10-CM | POA: Diagnosis not present

## 2021-04-04 DIAGNOSIS — N289 Disorder of kidney and ureter, unspecified: Secondary | ICD-10-CM | POA: Diagnosis not present

## 2021-04-04 DIAGNOSIS — I10 Essential (primary) hypertension: Secondary | ICD-10-CM | POA: Diagnosis not present

## 2021-04-04 LAB — BASIC METABOLIC PANEL
Anion gap: 10 (ref 5–15)
BUN: 15 mg/dL (ref 6–20)
CO2: 23 mmol/L (ref 22–32)
Calcium: 7.9 mg/dL — ABNORMAL LOW (ref 8.9–10.3)
Chloride: 105 mmol/L (ref 98–111)
Creatinine, Ser: 1.06 mg/dL (ref 0.61–1.24)
GFR, Estimated: >60 mL/min (ref >60)
Glucose, Bld: 109 mg/dL — ABNORMAL HIGH (ref 70–99)
Potassium: 3.1 mmol/L — ABNORMAL LOW (ref 3.5–5.1)
Sodium: 138 mmol/L (ref 135–145)

## 2021-04-04 LAB — CBC
HCT: 39.7 % (ref 39.0–52.0)
Hemoglobin: 13.1 g/dL (ref 13.0–17.0)
MCH: 27.5 pg (ref 26.0–34.0)
MCHC: 33 g/dL (ref 30.0–36.0)
MCV: 83.4 fL (ref 80.0–100.0)
Platelets: 329 10*3/uL (ref 150–400)
RBC: 4.76 MIL/uL (ref 4.22–5.81)
RDW: 15.6 % — ABNORMAL HIGH (ref 11.5–15.5)
WBC: 14.4 10*3/uL — ABNORMAL HIGH (ref 4.0–10.5)
nRBC: 0 % (ref 0.0–0.2)

## 2021-04-04 LAB — VDRL, CSF: VDRL Quant, CSF: NONREACTIVE

## 2021-04-04 LAB — RPR: RPR Ser Ql: NONREACTIVE

## 2021-04-04 MED ORDER — SALINE SPRAY 0.65 % NA SOLN
1.0000 | NASAL | Status: DC | PRN
  Administered 2021-04-05: 1 via NASAL
  Filled 2021-04-04: qty 44

## 2021-04-04 MED ORDER — POTASSIUM CHLORIDE CRYS ER 20 MEQ PO TBCR
40.0000 meq | EXTENDED_RELEASE_TABLET | Freq: Every day | ORAL | Status: DC
  Administered 2021-04-04: 12:00:00 40 meq via ORAL
  Filled 2021-04-04: qty 2

## 2021-04-04 NOTE — Progress Notes (Signed)
PROGRESS NOTE        PATIENT DETAILS Name: Jordan Donovan Age: 41 y.o. Sex: male Date of Birth: October 08, 1980 Admit Date: 04/03/2021 Admitting Physician Carollee Herter, DO ZOX:[id number], Marzella Schlein, MD  Brief Summary: Patient is a 41 y.o.  male with history of HTN-who presented with several days history of fever, headache and neck pain-upon further evaluation-thought to have viral meningitis.  Significant Hospital events: 3/8>> admit for evaluation of fever/headache/neck pain-suspected to have viral meningitis.  Significant imaging studies: 3/8>> CT head: Negative for acute abnormalities  Significant microbiology data: 3/8>> COVID/influenza PCR: Negative 3/8>> blood culture: Negative 3/8>> CSF culture: Negative 3/8>> CSF VDRL, HSV PCR: Pending  Procedures: 3/8>> fluoroscopy-guided lumbar puncture by radiology  Consults:  Radiology, infectious disease  Subjective: Feels better-no further headache or neck pain.  Objective: Vitals: Blood pressure (!) 128/47, pulse 89, temperature 98.1 F (36.7 C), temperature source Oral, resp. rate 16, height 6\' 5"  (1.956 m), weight (!) 145.2 kg, SpO2 97 %.   Exam: Gen Exam:Alert awake-not in any distress HEENT:atraumatic, normocephalic Chest: B/L clear to auscultation anteriorly CVS:S1S2 regular Abdomen:soft non tender, non distended Extremities:no edema Neurology: Non focal Skin: no rash  Pertinent Labs/Radiology: CBC Latest Ref Rng & Units 04/04/2021 04/03/2021 01/31/2018  WBC 4.0 - 10.5 K/uL 14.4(H) 16.6(H) 14.0(H)  Hemoglobin 13.0 - 17.0 g/dL 11.9 14.7 82.9  Hematocrit 39.0 - 52.0 % 39.7 43.5 44.7  Platelets 150 - 400 K/uL 329 385 362    Lab Results  Component Value Date   NA 138 04/04/2021   K 3.1 (L) 04/04/2021   CL 105 04/04/2021   CO2 23 04/04/2021      Assessment/Plan: Likely viral/aseptic meningitis: Clinical improvement overnight-reviewed CSF results-most consistent with viral meningitis.   Discussed with ID-all antimicrobial therapy being discontinued-plan is to follow clinically off antimicrobial therapy and await further CSF culture/data.  AKI: Mild-resolved-likely etiology being hemodynamic mediated kidney injury.  Hypokalemia: Replete and recheck  HTN: BP stable-continue amlodipine-resume chlorthalidone when appropriate.  Obesity: Estimated body mass index is 37.95 kg/m as calculated from the following:   Height as of this encounter: 6\' 5"  (1.956 m).   Weight as of this encounter: 145.2 kg.   Code status:   Code Status: Full Code   DVT Prophylaxis: Prophylactic Lovenox   Family Communication: Father at bedside   Disposition Plan: Status is: Inpatient Remains inpatient appropriate because: Aseptic meningitis-awaiting culture results.   Planned Discharge Destination:Home   Diet: Diet Order             Diet regular Room service appropriate? Yes; Fluid consistency: Thin  Diet effective now                     Antimicrobial agents: Anti-infectives (From admission, onward)    Start     Dose/Rate Route Frequency Ordered Stop   04/03/21 1700  acyclovir (ZOVIRAX) 1,000 mg in dextrose 5 % 150 mL IVPB  Status:  Discontinued        1,000 mg 170 mL/hr over 60 Minutes Intravenous Every 8 hours 04/03/21 1607 04/04/21 0945   04/03/21 1700  cefTRIAXone (ROCEPHIN) 2 g in sodium chloride 0.9 % 100 mL IVPB  Status:  Discontinued        2 g 200 mL/hr over 30 Minutes Intravenous Every 12 hours 04/03/21 1608 04/04/21 0947   04/03/21 1300  vancomycin (VANCOREADY) IVPB 1500 mg/300 mL  Status:  Discontinued        1,500 mg 150 mL/hr over 120 Minutes Intravenous Every 8 hours 04/03/21 0615 04/04/21 0947   04/03/21 0445  vancomycin (VANCOCIN) IVPB 1000 mg/200 mL premix        1,000 mg 200 mL/hr over 60 Minutes Intravenous Every 1 hr x 2 04/03/21 0431 04/03/21 0718   04/03/21 0430  cefTRIAXone (ROCEPHIN) 2 g in sodium chloride 0.9 % 100 mL IVPB        2 g 200 mL/hr  over 30 Minutes Intravenous  Once 04/03/21 0423 04/03/21 0605   04/03/21 0430  vancomycin (VANCOCIN) IVPB 1000 mg/200 mL premix  Status:  Discontinued        1,000 mg 200 mL/hr over 60 Minutes Intravenous  Once 04/03/21 0423 04/03/21 0431        MEDICATIONS: Scheduled Meds:  amLODipine  10 mg Oral Daily   enoxaparin (LOVENOX) injection  70 mg Subcutaneous Q24H   loratadine  10 mg Oral Daily   potassium chloride  40 mEq Oral STAT   sodium chloride flush  3 mL Intravenous Q12H   Continuous Infusions:  sodium chloride Stopped (04/03/21 0749)   PRN Meds:.acetaminophen **OR** acetaminophen, albuterol, butalbital-acetaminophen-caffeine, gabapentin, hydrALAZINE, ondansetron **OR** ondansetron (ZOFRAN) IV   I have personally reviewed following labs and imaging studies  LABORATORY DATA: CBC: Recent Labs  Lab 04/03/21 0202 04/04/21 0220  WBC 16.6* 14.4*  NEUTROABS 14.1*  --   HGB 13.8 13.1  HCT 43.5 39.7  MCV 84.6 83.4  PLT 385 329    Basic Metabolic Panel: Recent Labs  Lab 04/03/21 0202 04/04/21 0220  NA 140 138  K 3.3* 3.1*  CL 101 105  CO2 26 23  GLUCOSE 143* 109*  BUN 15 15  CREATININE 1.33* 1.06  CALCIUM 9.6 7.9*    GFR: Estimated Creatinine Clearance: 144.6 mL/min (by C-G formula based on SCr of 1.06 mg/dL).  Liver Function Tests: Recent Labs  Lab 04/03/21 0202  AST 21  ALT 25  ALKPHOS 72  BILITOT 0.3  PROT 7.2  ALBUMIN 4.2   No results for input(s): LIPASE, AMYLASE in the last 168 hours. No results for input(s): AMMONIA in the last 168 hours.  Coagulation Profile: Recent Labs  Lab 04/03/21 0214  INR 1.0    Cardiac Enzymes: No results for input(s): CKTOTAL, CKMB, CKMBINDEX, TROPONINI in the last 168 hours.  BNP (last 3 results) No results for input(s): PROBNP in the last 8760 hours.  Lipid Profile: No results for input(s): CHOL, HDL, LDLCALC, TRIG, CHOLHDL, LDLDIRECT in the last 72 hours.  Thyroid Function Tests: No results for  input(s): TSH, T4TOTAL, FREET4, T3FREE, THYROIDAB in the last 72 hours.  Anemia Panel: No results for input(s): VITAMINB12, FOLATE, FERRITIN, TIBC, IRON, RETICCTPCT in the last 72 hours.  Urine analysis:    Component Value Date/Time   COLORURINE YELLOW 04/03/2021 0210   APPEARANCEUR CLEAR 04/03/2021 0210   LABSPEC 1.025 04/03/2021 0210   PHURINE 7.0 04/03/2021 0210   GLUCOSEU NEGATIVE 04/03/2021 0210   HGBUR NEGATIVE 04/03/2021 0210   BILIRUBINUR NEGATIVE 04/03/2021 0210   KETONESUR NEGATIVE 04/03/2021 0210   PROTEINUR TRACE (A) 04/03/2021 0210   NITRITE NEGATIVE 04/03/2021 0210   LEUKOCYTESUR NEGATIVE 04/03/2021 0210    Sepsis Labs: Lactic Acid, Venous    Component Value Date/Time   LATICACIDVEN 1.5 04/03/2021 0457    MICROBIOLOGY: Recent Results (from the past 240 hour(s))  Resp Panel by RT-PCR (Flu  A&B, Covid) Nasopharyngeal Swab     Status: None   Collection Time: 04/03/21  2:10 AM   Specimen: Nasopharyngeal Swab; Nasopharyngeal(NP) swabs in vial transport medium  Result Value Ref Range Status   SARS Coronavirus 2 by RT PCR NEGATIVE NEGATIVE Final    Comment: (NOTE) SARS-CoV-2 target nucleic acids are NOT DETECTED.  The SARS-CoV-2 RNA is generally detectable in upper respiratory specimens during the acute phase of infection. The lowest concentration of SARS-CoV-2 viral copies this assay can detect is 138 copies/mL. A negative result does not preclude SARS-Cov-2 infection and should not be used as the sole basis for treatment or other patient management decisions. A negative result may occur with  improper specimen collection/handling, submission of specimen other than nasopharyngeal swab, presence of viral mutation(s) within the areas targeted by this assay, and inadequate number of viral copies(<138 copies/mL). A negative result must be combined with clinical observations, patient history, and epidemiological information. The expected result is Negative.  Fact  Sheet for Patients:  BloggerCourse.com  Fact Sheet for Healthcare Providers:  SeriousBroker.it  This test is no t yet approved or cleared by the [place] FDA and  has been authorized for detection and/or diagnosis of SARS-CoV-2 by FDA under an Emergency Use Authorization (EUA). This EUA will remain  in effect (meaning this test can be used) for the duration of the COVID-19 declaration under Section 564(b)(1) of the Act, 21 U.S.C.section 360bbb-3(b)(1), unless the authorization is terminated  or revoked sooner.       Influenza A by PCR NEGATIVE NEGATIVE Final   Influenza B by PCR NEGATIVE NEGATIVE Final    Comment: (NOTE) The Xpert Xpress SARS-CoV-2/FLU/RSV plus assay is intended as an aid in the diagnosis of influenza from Nasopharyngeal swab specimens and should not be used as a sole basis for treatment. Nasal washings and aspirates are unacceptable for Xpert Xpress SARS-CoV-2/FLU/RSV testing.  Fact Sheet for Patients: BloggerCourse.com  Fact Sheet for Healthcare Providers: SeriousBroker.it  This test is not yet approved or cleared by the [place] FDA and has been authorized for detection and/or diagnosis of SARS-CoV-2 by FDA under an Emergency Use Authorization (EUA). This EUA will remain in effect (meaning this test can be used) for the duration of the COVID-19 declaration under Section 564(b)(1) of the Act, 21 U.S.C. section 360bbb-3(b)(1), unless the authorization is terminated or revoked.  Performed at [facility], [address], [place], [place] [address]   Blood culture (routine x 2)     Status: None (Preliminary result)   Collection Time: 04/03/21  4:57 AM   Specimen: BLOOD  Result Value Ref Range Status   Specimen Description   Final    BLOOD RIGHT FOREARM Performed at [facility], [address]  [place], [place] [address]    Special Requests   Final    BOTTLES DRAWN AEROBIC AND ANAEROBIC Blood Culture adequate volume Performed at [facility], [address] [place], [place] [address]    Culture   Final    NO GROWTH 1 DAY Performed at [facility], [address] [place], [place] [address]    Report Status PENDING  Incomplete  Blood culture (routine x 2)     Status: None (Preliminary result)   Collection Time: 04/03/21  4:57 AM   Specimen: BLOOD  Result Value Ref Range Status   Specimen Description   Final    BLOOD LEFT FOREARM Performed at [facility], [address], [place], [place]  [address]    Special Requests   Final    BOTTLES DRAWN AEROBIC AND ANAEROBIC Blood Culture adequate volume Performed at [facility], [address], [place], [place] [address]    Culture   Final    NO GROWTH 1 DAY Performed at [facility], [address], [place], [place] [address]    Report Status PENDING  Incomplete  CSF culture w Gram Stain     Status: None (Preliminary result)   Collection Time: 04/03/21  1:44 PM   Specimen: PATH Cytology CSF; Cerebrospinal Fluid  Result Value Ref Range Status   Specimen Description CSF  Final   Special Requests NONE  Final   Gram Stain   Final    WBC PRESENT,BOTH PMN AND MONONUCLEAR NO ORGANISMS SEEN CYTOSPIN SMEAR    Culture   Final    NO GROWTH < 24 HOURS Performed at [facility], [address], [place], [place] [address]    Report Status PENDING  Incomplete    RADIOLOGY STUDIES/RESULTS: CT Head Wo Contrast  Result Date: 04/02/2021 CLINICAL DATA:  Headache EXAM: CT HEAD WITHOUT CONTRAST TECHNIQUE: Contiguous axial images were obtained from the base of the skull through the vertex without intravenous contrast. RADIATION DOSE REDUCTION: This exam was performed according to the departmental dose-optimization program which includes automated exposure control,  adjustment of the mA and/or kV according to patient size and/or use of iterative reconstruction technique. COMPARISON:  None. FINDINGS: Brain: No acute territorial infarction, hemorrhage or intracranial mass. The ventricles are nonenlarged. Vascular: No hyperdense vessel or unexpected calcification. Skull: Normal. Negative for fracture or focal lesion. Sinuses/Orbits: No acute finding. Other: None IMPRESSION: Negative non contrasted CT appearance of the brain Electronically Signed   By: Jasmine Pang M.D.   On: 04/02/2021 22:08   DG Chest Port 1 View  Result Date: 04/03/2021 CLINICAL DATA:  Headaches and nausea, initial encounter EXAM: PORTABLE CHEST 1 VIEW COMPARISON:  02/22/2018 FINDINGS: The heart size and mediastinal contours are within normal limits. Both lungs are clear. The visualized skeletal structures are unremarkable. IMPRESSION: No active disease. Electronically Signed   By: Alcide Clever M.D.   On: 04/03/2021 02:42   DG FL GUIDED LUMBAR PUNCTURE  Result Date: 04/03/2021 CLINICAL DATA:  41 year old male with acute head and neck pain associated with fevers. Diagnostic lumbar puncture requested. EXAM: LUMBAR PUNCTURE UNDER FLUOROSCOPY PROCEDURE: An appropriate skin entry site was determined fluoroscopically. Operator donned sterile gloves and mask. Skin site was marked, then prepped with Betadine, draped in usual sterile fashion, and infiltrated locally with 1% lidocaine. A 6 inch 20 gauge spinal needle advanced into the thecal sac at L3-L4 from a left interlaminar approach. Clear colorless CSF spontaneously returned, with opening pressure of 23 cm water. 11 ml CSF were collected and divided among 4 sterile vials for the requested laboratory studies. The needle was then removed. The patient tolerated the procedure well and there were no complications. FLUOROSCOPY: Radiation Exposure Index (as provided by the fluoroscopic device): 6.2 mGy Kerma IMPRESSION: Technically successful lumbar puncture under  fluoroscopy. This exam was performed by Loyce Dys PA-C, and was supervised and interpreted by Paulina Fusi, MD. Electronically Signed   By: Paulina Fusi M.D.   On: 04/03/2021 14:38     LOS: 1 day   Jeoffrey Massed, MD  [facility]    To contact the attending provider between 7A-7P or the covering provider during after hours 7P-7A, please log into the web site  [email or web address] and access using universal [place] password for that web site. If you do not have the password, please call the hospital operator.  04/04/2021, 11:38 AM

## 2021-04-04 NOTE — Consult Note (Signed)
[facility]       Reason for Consult: meningitis    Referring Physician: Dr. Katrinka Blazing  Principal Problem:   Suspected infectious meningitis Active Problems:   Essential hypertension   Obesity (BMI 30-39.9)   Mixed hyperlipidemia   Hypokalemia   Renal insufficiency   Headache    amLODipine  10 mg Oral Daily   enoxaparin (LOVENOX) injection  70 mg Subcutaneous Q24H   loratadine  10 mg Oral Daily   potassium chloride  40 mEq Oral STAT   sodium chloride flush  3 mL Intravenous Q12H    Recommendations:  Stop vancomycin, ceftriaxone and acyclovir  Stop steroids D/c isolation precautions  Assessment: He has headache, fever and neck pain with a recent sick contact and cSF studies concerning for viral meningitis.  I mostly suspect this is related to his recent exposure to a child with a vial illness.  No indication for any treatment for viral meningitis as it is self-limited including HSV-related meningitis.   Will stop antibiotics and observe off of antibiotics.   This is not c/w Meningococcal meningitis so will d/c isolation precautions  Antibiotics: Vancomycin, ceftriaxone, acyclovir, steroids  HPI: Jordan Donovan is a 41 y.o. male with a history of hypertension, hyperlipedemia came in with via EMS with headache and neck pain.  This progressed to dizziness and vomited x 1 and brought to the ED.  Fever to 101.5 in the ED and CSF studies with 500 WBCs with lymphocytic and monocytic predominance.  Protein elevated at 172 with 99 RBCs.  He had a sick contact with a child with a viral illness with high fevers. Had neck pain.  Admitted and placed on antibiotics and antivirals.  He feels better this am since admission.  No further fever and WBC down to 14.4.   Review of Systems:  Constitutional: positive for fevers Musculoskeletal: positive for neck pain, now resolved All other systems reviewed and are negative    Past Medical History:  Diagnosis Date    Degenerative disc disease, lumbar    Hypertension    Mixed hyperlipidemia    Obesity (BMI 30-39.9)     Social History   Tobacco Use   Smoking status: Former    Types: Cigarettes    Quit date: 01/27/2005    Years since quitting: 16.1   Smokeless tobacco: Never  Substance Use Topics   Alcohol use: No    Alcohol/week: 0.0 standard drinks   Drug use: Never    Family History  Problem Relation Age of Onset   Hypertension Mother    CVA Father    Hyperlipidemia Father    Healthy Brother    Healthy Daughter    Healthy Daughter    Healthy Son     No Known Allergies  Physical Exam: Constitutional: in no apparent distress  Vitals:   04/03/21 2300 04/04/21 0814  BP: 126/63 (!) 128/47  Pulse: 94 89  Resp: (!) 21 16  Temp: 98.7 F (37.1 C) 98.1 F (36.7 C)  SpO2: 91% 97%   EYES: anicteric Respiratory: normal respiratory effort Musculoskeletal: no edema Skin: no rash Neuro: non-focal  Lab Results  Component Value Date   WBC 14.4 (H) 04/04/2021   HGB 13.1 04/04/2021   HCT 39.7 04/04/2021   MCV 83.4 04/04/2021   PLT 329 04/04/2021    Lab Results  Component Value Date   CREATININE 1.06 04/04/2021   BUN 15 04/04/2021   NA 138 04/04/2021   K 3.1 (  L) 04/04/2021   CL 105 04/04/2021   CO2 23 04/04/2021    Lab Results  Component Value Date   ALT 25 04/03/2021   AST 21 04/03/2021   ALKPHOS 72 04/03/2021     Microbiology: Recent Results (from the past 240 hour(s))  Resp Panel by RT-PCR (Flu A&B, Covid) Nasopharyngeal Swab     Status: None   Collection Time: 04/03/21  2:10 AM   Specimen: Nasopharyngeal Swab; Nasopharyngeal(NP) swabs in vial transport medium  Result Value Ref Range Status   SARS Coronavirus 2 by RT PCR NEGATIVE NEGATIVE Final    Comment: (NOTE) SARS-CoV-2 target nucleic acids are NOT DETECTED.  The SARS-CoV-2 RNA is generally detectable in upper respiratory specimens during the acute phase of infection. The lowest concentration of SARS-CoV-2  viral copies this assay can detect is 138 copies/mL. A negative result does not preclude SARS-Cov-2 infection and should not be used as the sole basis for treatment or other patient management decisions. A negative result may occur with  improper specimen collection/handling, submission of specimen other than nasopharyngeal swab, presence of viral mutation(s) within the areas targeted by this assay, and inadequate number of viral copies(<138 copies/mL). A negative result must be combined with clinical observations, patient history, and epidemiological information. The expected result is Negative.  Fact Sheet for Patients:  BloggerCourse.com  Fact Sheet for Healthcare Providers:  SeriousBroker.it  This test is no t yet approved or cleared by the [place] FDA and  has been authorized for detection and/or diagnosis of SARS-CoV-2 by FDA under an Emergency Use Authorization (EUA). This EUA will remain  in effect (meaning this test can be used) for the duration of the COVID-19 declaration under Section 564(b)(1) of the Act, 21 U.S.C.section 360bbb-3(b)(1), unless the authorization is terminated  or revoked sooner.       Influenza A by PCR NEGATIVE NEGATIVE Final   Influenza B by PCR NEGATIVE NEGATIVE Final    Comment: (NOTE) The Xpert Xpress SARS-CoV-2/FLU/RSV plus assay is intended as an aid in the diagnosis of influenza from Nasopharyngeal swab specimens and should not be used as a sole basis for treatment. Nasal washings and aspirates are unacceptable for Xpert Xpress SARS-CoV-2/FLU/RSV testing.  Fact Sheet for Patients: BloggerCourse.com  Fact Sheet for Healthcare Providers: SeriousBroker.it  This test is not yet approved or cleared by the [place] FDA and has been authorized for detection and/or diagnosis of SARS-CoV-2 by FDA under an Emergency Use Authorization  (EUA). This EUA will remain in effect (meaning this test can be used) for the duration of the COVID-19 declaration under Section 564(b)(1) of the Act, 21 U.S.C. section 360bbb-3(b)(1), unless the authorization is terminated or revoked.  Performed at [facility], [address] [place], [place] [address]   Blood culture (routine x 2)     Status: None (Preliminary result)   Collection Time: 04/03/21  4:57 AM   Specimen: BLOOD  Result Value Ref Range Status   Specimen Description   Final    BLOOD RIGHT FOREARM Performed at [facility], [address], [place], [place] [address]    Special Requests   Final    BOTTLES DRAWN AEROBIC AND ANAEROBIC Blood Culture adequate volume Performed at [facility], [address] [place], [place] [address]    Culture   Final    NO GROWTH <12 HOURS Performed at [facility], [address], [place], [place] [address]    Report Status PENDING  Incomplete  Blood  culture (routine x 2)     Status: None (Preliminary result)   Collection Time: 04/03/21  4:57 AM   Specimen: BLOOD  Result Value Ref Range Status   Specimen Description   Final    BLOOD LEFT FOREARM Performed at [facility], [address] [place], [place] [address]    Special Requests   Final    BOTTLES DRAWN AEROBIC AND ANAEROBIC Blood Culture adequate volume Performed at [facility], [address] [place], [place] [address]    Culture   Final    NO GROWTH <12 HOURS Performed at [facility], [address], [place], [place] [address]    Report Status PENDING  Incomplete  CSF culture w Gram Stain     Status: None (Preliminary result)   Collection Time: 04/03/21  1:44 PM   Specimen: PATH Cytology CSF; Cerebrospinal Fluid  Result Value Ref Range Status   Specimen Description CSF  Final   Special Requests NONE  Final   Gram Stain   Final    WBC PRESENT,BOTH PMN  AND MONONUCLEAR NO ORGANISMS SEEN CYTOSPIN SMEAR    Culture   Final    NO GROWTH < 24 HOURS Performed at [facility], [address] [place], [place] [address]    Report Status PENDING  Incomplete    Gardiner Barefoot, MD [facility] [email or web address] 04/04/2021, 10:12 AM

## 2021-04-05 DIAGNOSIS — I10 Essential (primary) hypertension: Secondary | ICD-10-CM | POA: Diagnosis not present

## 2021-04-05 DIAGNOSIS — R29818 Other symptoms and signs involving the nervous system: Secondary | ICD-10-CM | POA: Diagnosis not present

## 2021-04-05 DIAGNOSIS — N289 Disorder of kidney and ureter, unspecified: Secondary | ICD-10-CM | POA: Diagnosis not present

## 2021-04-05 LAB — CBC
HCT: 39.8 % (ref 39.0–52.0)
Hemoglobin: 13.3 g/dL (ref 13.0–17.0)
MCH: 27.9 pg (ref 26.0–34.0)
MCHC: 33.4 g/dL (ref 30.0–36.0)
MCV: 83.4 fL (ref 80.0–100.0)
Platelets: 314 10*3/uL (ref 150–400)
RBC: 4.77 MIL/uL (ref 4.22–5.81)
RDW: 15.5 % (ref 11.5–15.5)
WBC: 11.2 10*3/uL — ABNORMAL HIGH (ref 4.0–10.5)
nRBC: 0 % (ref 0.0–0.2)

## 2021-04-05 LAB — BASIC METABOLIC PANEL
Anion gap: 8 (ref 5–15)
BUN: 12 mg/dL (ref 6–20)
CO2: 26 mmol/L (ref 22–32)
Calcium: 8 mg/dL — ABNORMAL LOW (ref 8.9–10.3)
Chloride: 105 mmol/L (ref 98–111)
Creatinine, Ser: 1.14 mg/dL (ref 0.61–1.24)
GFR, Estimated: >60 mL/min (ref >60)
Glucose, Bld: 110 mg/dL — ABNORMAL HIGH (ref 70–99)
Potassium: 3.2 mmol/L — ABNORMAL LOW (ref 3.5–5.1)
Sodium: 139 mmol/L (ref 135–145)

## 2021-04-05 LAB — MAGNESIUM: Magnesium: 2 mg/dL (ref 1.7–2.4)

## 2021-04-05 LAB — CYTOLOGY - NON PAP

## 2021-04-05 MED ORDER — FLUTICASONE PROPIONATE 50 MCG/ACT NA SUSP: 1.0000 | Freq: Every day | NASAL | 2 refills | Status: DC

## 2021-04-05 MED ORDER — POTASSIUM CHLORIDE CRYS ER 20 MEQ PO TBCR
40.0000 meq | EXTENDED_RELEASE_TABLET | ORAL | Status: DC
  Administered 2021-04-05: 40 meq via ORAL
  Filled 2021-04-05: qty 2

## 2021-04-05 NOTE — Discharge Summary (Signed)
PATIENT DETAILS Name: Jordan Donovan Age: 40 y.o. Sex: male Date of Birth: Oct 28, 1980 MRN: [id number]. Admitting Physician: Kristopher Oppenheim, DO [id number], Dionne Bucy, MD  Admit Date: 04/03/2021 Discharge date: 04/05/2021  Recommendations for Outpatient Follow-up:  Follow up with PCP in 1-2 weeks Please obtain CMP/CBC in one week Follow CSF cultures until final CSF HSV PCR, fungal cultures, cytology-pending-please follow.  cultures  Admitted From:  Home  Disposition: Home   Discharge Condition: good  CODE STATUS:   Code Status: Full Code   Diet recommendation:  Diet Order             Diet - low sodium heart healthy           Diet regular Room service appropriate? Yes; Fluid consistency: Thin  Diet effective now                    Brief Summary: Patient is a 41 y.o.  male with history of HTN-who presented with several days history of fever, headache and neck pain-upon further evaluation-thought to have viral meningitis.   Significant Hospital events: 3/8>> admit for evaluation of fever/headache/neck pain-suspected to have viral meningitis.   Significant imaging studies: 3/8>> CT head: Negative for acute abnormalities   Significant microbiology data: 3/8>> COVID/influenza PCR: Negative 3/8>> blood culture: Negative 3/8>> CSF culture: Negative 3/8>> CSF VDRL, HSV PCR: Pending   Procedures: 3/8>> fluoroscopy-guided lumbar puncture by radiology   Consults:  Radiology, infectious disease  Brief Hospital Course: Likely viral/aseptic meningitis: Managed with supportive care-initially on broad-spectrum antimicrobial therapy-however since CSF results were not consistent with bacterial meningitis-all antimicrobial therapy was discontinued on 3/9.  Was evaluated by ID as well.  Even without antimicrobial therapy-clinical improvement continues-he has minimal headache but otherwise feels much better.  Discussed with infectious disease MD today-Dr. Denny Peon for  discharge.  PCP will need to follow pending labs as indicated above.     AKI: Mild-resolved-likely etiology being hemodynamic mediated kidney injury.   Hypokalemia: Replete and recheck   HTN: BP stable-continue amlodipine-resume chlorthalidone when appropriate.   Obesity: Estimated body mass index is 37.95 kg/m as calculated from the following:   Height as of this encounter: 6\' 5"  (1.956 m).   Weight as of this encounter: 145.2 kg.    Discharge Diagnoses:  Principal Problem:   Suspected infectious meningitis Active Problems:   Essential hypertension   Obesity (BMI 30-39.9)   Mixed hyperlipidemia   Hypokalemia   Renal insufficiency   Headache   Discharge Instructions:  Activity:  As tolerated   Discharge Instructions     Call MD for:  persistant nausea and vomiting   Complete by: As directed    Call MD for:  severe uncontrolled pain   Complete by: As directed    Call MD for:  temperature >100.4   Complete by: As directed    Diet - low sodium heart healthy   Complete by: As directed    Discharge instructions   Complete by: As directed    Follow with Primary MD  Virginia Crews, MD in 1-2 weeks  Ask your primary care practitioner to follow up on final culture results from your spinal fluid, there are also some labs that are pending from your spinal fluid (HSV PCR, fungal cultures, cytology) that your primary care practitioner will need to follow.  Please get a complete blood count and chemistry panel checked by your Primary MD at your next visit, and again as instructed by your Primary MD.  Get Medicines reviewed and adjusted: Please take all your medications with you for your next visit with your Primary MD  Laboratory/radiological data: Please request your Primary MD to go over all hospital tests and procedure/radiological results at the follow up, please ask your Primary MD to get all Hospital records sent to his/her office.  In some cases, they will be  blood work, cultures and biopsy results pending at the time of your discharge. Please request that your primary care M.D. follows up on these results.  Also Note the following: If you experience worsening of your admission symptoms, develop shortness of breath, life threatening emergency, suicidal or homicidal thoughts you must seek medical attention immediately by calling 911 or calling your MD immediately  if symptoms less severe.  You must read complete instructions/literature along with all the possible adverse reactions/side effects for all the Medicines you take and that have been prescribed to you. Take any new Medicines after you have completely understood and accpet all the possible adverse reactions/side effects.   Do not drive when taking Pain medications or sleeping medications (Benzodaizepines)  Do not take more than prescribed Pain, Sleep and Anxiety Medications. It is not advisable to combine anxiety,sleep and pain medications without talking with your primary care practitioner  Special Instructions: If you have smoked or chewed Tobacco  in the last 2 yrs please stop smoking, stop any regular Alcohol  and or any Recreational drug use.  Wear Seat belts while driving.  Please note: You were cared for by a hospitalist during your hospital stay. Once you are discharged, your primary care physician will handle any further medical issues. Please note that NO REFILLS for any discharge medications will be authorized once you are discharged, as it is imperative that you return to your primary care physician (or establish a relationship with a primary care physician if you do not have one) for your post hospital discharge needs so that they can reassess your need for medications and monitor your lab values.   Increase activity slowly   Complete by: As directed       Allergies as of 04/05/2021   No Known Allergies      Medication List     TAKE these medications    amLODipine 10 MG  tablet Commonly known as: NORVASC TAKE 1 TABLET(10 MG) BY MOUTH DAILY What changed:  how much to take how to take this when to take this additional instructions   cetirizine 10 MG tablet Commonly known as: ZYRTEC Take 10 mg by mouth daily.   chlorthalidone 25 MG tablet Commonly known as: HYGROTON TAKE 1 TABLET(25 MG) BY MOUTH DAILY What changed:  how much to take how to take this when to take this additional instructions   fluticasone 50 MCG/ACT nasal spray Commonly known as: FLONASE Place 1 spray into both nostrils daily.   gabapentin 300 MG capsule Commonly known as: NEURONTIN Take 1 capsule (300 mg total) by mouth 3 (three) times daily. What changed:  when to take this reasons to take this   multivitamin tablet Take 1 tablet by mouth daily.   naproxen 500 MG tablet Commonly known as: NAPROSYN Take 500 mg by mouth 2 (two) times daily as needed for mild pain.        Follow-up Information     Bacigalupo, Dionne Bucy, MD. Schedule an appointment as soon as possible for a visit in 1 week(s).   Specialty: Family Medicine Contact information: [address] [place] [address]  [phone]                No Known Allergies   Other Procedures/Studies: CT Head Wo Contrast  Result Date: 04/02/2021 CLINICAL DATA:  Headache EXAM: CT HEAD WITHOUT CONTRAST TECHNIQUE: Contiguous axial images were obtained from the base of the skull through the vertex without intravenous contrast. RADIATION DOSE REDUCTION: This exam was performed according to the departmental dose-optimization program which includes automated exposure control, adjustment of the mA and/or kV according to patient size and/or use of iterative reconstruction technique. COMPARISON:  None. FINDINGS: Brain: No acute territorial infarction, hemorrhage or intracranial mass. The ventricles are nonenlarged. Vascular: No hyperdense vessel or unexpected calcification. Skull: Normal. Negative for  fracture or focal lesion. Sinuses/Orbits: No acute finding. Other: None IMPRESSION: Negative non contrasted CT appearance of the brain Electronically Signed   By: Donavan Foil M.D.   On: 04/02/2021 22:08   DG Chest Port 1 View  Result Date: 04/03/2021 CLINICAL DATA:  Headaches and nausea, initial encounter EXAM: PORTABLE CHEST 1 VIEW COMPARISON:  02/22/2018 FINDINGS: The heart size and mediastinal contours are within normal limits. Both lungs are clear. The visualized skeletal structures are unremarkable. IMPRESSION: No active disease. Electronically Signed   By: Inez Catalina M.D.   On: 04/03/2021 02:42   DG FL GUIDED LUMBAR PUNCTURE  Result Date: 04/03/2021 CLINICAL DATA:  41 year old male with acute head and neck pain associated with fevers. Diagnostic lumbar puncture requested. EXAM: LUMBAR PUNCTURE UNDER FLUOROSCOPY PROCEDURE: An appropriate skin entry site was determined fluoroscopically. Operator donned sterile gloves and mask. Skin site was marked, then prepped with Betadine, draped in usual sterile fashion, and infiltrated locally with 1% lidocaine. A 6 inch 20 gauge spinal needle advanced into the thecal sac at L3-L4 from a left interlaminar approach. Clear colorless CSF spontaneously returned, with opening pressure of 23 cm water. 11 ml CSF were collected and divided among 4 sterile vials for the requested laboratory studies. The needle was then removed. The patient tolerated the procedure well and there were no complications. FLUOROSCOPY: Radiation Exposure Index (as provided by the fluoroscopic device): 6.2 mGy Kerma IMPRESSION: Technically successful lumbar puncture under fluoroscopy. This exam was performed by Brynda Greathouse PA-C, and was supervised and interpreted by Nelson Chimes, MD. Electronically Signed   By: Nelson Chimes M.D.   On: 04/03/2021 14:38     TODAY-DAY OF DISCHARGE:  Subjective:   Merlene Laughter today has no headache,no chest abdominal pain,no new weakness tingling or  numbness, feels much better wants to go home today.   Objective:   Blood pressure (!) 135/94, pulse 95, temperature 99.4 F (37.4 C), temperature source Oral, resp. rate 20, height 6\' 5"  (1.956 m), weight (!) 145.2 kg, SpO2 92 %. No intake or output data in the 24 hours ending 04/05/21 1015 Filed Weights   04/02/21 2113  Weight: (!) 145.2 kg    Exam: Awake Alert, Oriented *3, No new F.N deficits, Normal affect [place].AT,PERRAL Supple Neck,No JVD, No cervical lymphadenopathy appriciated.  Symmetrical Chest wall movement, Good air movement bilaterally, CTAB RRR,No Gallops,Rubs or new Murmurs, No Parasternal Heave +ve B.Sounds, Abd Soft, Non tender, No organomegaly appriciated, No rebound -guarding or rigidity. No Cyanosis, Clubbing or edema, No new Rash or bruise   PERTINENT RADIOLOGIC STUDIES: DG FL GUIDED LUMBAR PUNCTURE  Result Date: 04/03/2021 CLINICAL DATA:  41 year old male with acute head and neck pain associated with fevers. Diagnostic lumbar puncture requested. EXAM: LUMBAR PUNCTURE UNDER FLUOROSCOPY PROCEDURE: An  appropriate skin entry site was determined fluoroscopically. Operator donned sterile gloves and mask. Skin site was marked, then prepped with Betadine, draped in usual sterile fashion, and infiltrated locally with 1% lidocaine. A 6 inch 20 gauge spinal needle advanced into the thecal sac at L3-L4 from a left interlaminar approach. Clear colorless CSF spontaneously returned, with opening pressure of 23 cm water. 11 ml CSF were collected and divided among 4 sterile vials for the requested laboratory studies. The needle was then removed. The patient tolerated the procedure well and there were no complications. FLUOROSCOPY: Radiation Exposure Index (as provided by the fluoroscopic device): 6.2 mGy Kerma IMPRESSION: Technically successful lumbar puncture under fluoroscopy. This exam was performed by Loyce Dys PA-C, and was supervised and interpreted by Paulina Fusi, MD.  Electronically Signed   By: Paulina Fusi M.D.   On: 04/03/2021 14:38     PERTINENT LAB RESULTS: CBC: Recent Labs    04/04/21 0220 04/05/21 0142  WBC 14.4* 11.2*  HGB 13.1 13.3  HCT 39.7 39.8  PLT 329 314   CMET CMP     Component Value Date/Time   NA 139 04/05/2021 0142   NA 143 04/05/2020 1403   K 3.2 (L) 04/05/2021 0142   CL 105 04/05/2021 0142   CO2 26 04/05/2021 0142   GLUCOSE 110 (H) 04/05/2021 0142   BUN 12 04/05/2021 0142   BUN 15 04/05/2020 1403   CREATININE 1.14 04/05/2021 0142   CREATININE 1.15 10/02/2016 0937   CALCIUM 8.0 (L) 04/05/2021 0142   PROT 7.2 04/03/2021 0202   PROT 7.4 04/05/2020 1403   ALBUMIN 4.2 04/03/2021 0202   ALBUMIN 4.6 04/05/2020 1403   AST 21 04/03/2021 0202   ALT 25 04/03/2021 0202   ALKPHOS 72 04/03/2021 0202   BILITOT 0.3 04/03/2021 0202   BILITOT 0.4 04/05/2020 1403   GFRNONAA >60 04/05/2021 0142   GFRAA 98 10/10/2019 1340    GFR Estimated Creatinine Clearance: 134.5 mL/min (by C-G formula based on SCr of 1.14 mg/dL). No results for input(s): LIPASE, AMYLASE in the last 72 hours. No results for input(s): CKTOTAL, CKMB, CKMBINDEX, TROPONINI in the last 72 hours. Invalid input(s): POCBNP No results for input(s): DDIMER in the last 72 hours. No results for input(s): HGBA1C in the last 72 hours. No results for input(s): CHOL, HDL, LDLCALC, TRIG, CHOLHDL, LDLDIRECT in the last 72 hours. No results for input(s): TSH, T4TOTAL, T3FREE, THYROIDAB in the last 72 hours.  Invalid input(s): FREET3 No results for input(s): VITAMINB12, FOLATE, FERRITIN, TIBC, IRON, RETICCTPCT in the last 72 hours. Coags: Recent Labs    04/03/21 0214  INR 1.0   Microbiology: Recent Results (from the past 240 hour(s))  Resp Panel by RT-PCR (Flu A&B, Covid) Nasopharyngeal Swab     Status: None   Collection Time: 04/03/21  2:10 AM   Specimen: Nasopharyngeal Swab; Nasopharyngeal(NP) swabs in vial transport medium  Result Value Ref Range Status   SARS  Coronavirus 2 by RT PCR NEGATIVE NEGATIVE Final    Comment: (NOTE) SARS-CoV-2 target nucleic acids are NOT DETECTED.  The SARS-CoV-2 RNA is generally detectable in upper respiratory specimens during the acute phase of infection. The lowest concentration of SARS-CoV-2 viral copies this assay can detect is 138 copies/mL. A negative result does not preclude SARS-Cov-2 infection and should not be used as the sole basis for treatment or other patient management decisions. A negative result may occur with  improper specimen collection/handling, submission of specimen other than nasopharyngeal swab, presence of viral mutation(s)  within the areas targeted by this assay, and inadequate number of viral copies(<138 copies/mL). A negative result must be combined with clinical observations, patient history, and epidemiological information. The expected result is Negative.  Fact Sheet for Patients:  EntrepreneurPulse.com.au  Fact Sheet for Healthcare Providers:  IncredibleEmployment.be  This test is no t yet approved or cleared by the [place] FDA and  has been authorized for detection and/or diagnosis of SARS-CoV-2 by FDA under an Emergency Use Authorization (EUA). This EUA will remain  in effect (meaning this test can be used) for the duration of the COVID-19 declaration under Section 564(b)(1) of the Act, 21 U.S.C.section 360bbb-3(b)(1), unless the authorization is terminated  or revoked sooner.       Influenza A by PCR NEGATIVE NEGATIVE Final   Influenza B by PCR NEGATIVE NEGATIVE Final    Comment: (NOTE) The Xpert Xpress SARS-CoV-2/FLU/RSV plus assay is intended as an aid in the diagnosis of influenza from Nasopharyngeal swab specimens and should not be used as a sole basis for treatment. Nasal washings and aspirates are unacceptable for Xpert Xpress SARS-CoV-2/FLU/RSV testing.  Fact Sheet for  Patients: EntrepreneurPulse.com.au  Fact Sheet for Healthcare Providers: IncredibleEmployment.be  This test is not yet approved or cleared by the [place] FDA and has been authorized for detection and/or diagnosis of SARS-CoV-2 by FDA under an Emergency Use Authorization (EUA). This EUA will remain in effect (meaning this test can be used) for the duration of the COVID-19 declaration under Section 564(b)(1) of the Act, 21 U.S.C. section 360bbb-3(b)(1), unless the authorization is terminated or revoked.  Performed at [facility], [address], [place], [place] [address]   Blood culture (routine x 2)     Status: None (Preliminary result)   Collection Time: 04/03/21  4:57 AM   Specimen: BLOOD  Result Value Ref Range Status   Specimen Description   Final    BLOOD RIGHT FOREARM Performed at [facility], [address], [place], [place] [address]    Special Requests   Final    BOTTLES DRAWN AEROBIC AND ANAEROBIC Blood Culture adequate volume Performed at [facility], [address], [place], [place] [address]    Culture   Final    NO GROWTH 1 DAY Performed at [facility], [place] [address] [place], [place] [address]    Report Status PENDING  Incomplete  Blood culture (routine x 2)     Status: None (Preliminary result)   Collection Time: 04/03/21  4:57 AM   Specimen: BLOOD  Result Value Ref Range Status   Specimen Description   Final    BLOOD LEFT FOREARM Performed at [facility], [address] [place], [place] [address]    Special Requests   Final    BOTTLES DRAWN AEROBIC AND ANAEROBIC Blood Culture adequate volume Performed at [facility], [address], [place], [place] [address]    Culture   Final    NO GROWTH 1 DAY Performed at [facility], [place] [address], [place],  [address]    Report Status PENDING   Incomplete  CSF culture w Gram Stain     Status: None (Preliminary result)   Collection Time: 04/03/21  1:44 PM   Specimen: PATH Cytology CSF; Cerebrospinal Fluid  Result Value Ref Range Status   Specimen Description CSF  Final   Special Requests NONE  Final   Gram Stain   Final    WBC PRESENT,BOTH PMN AND MONONUCLEAR NO  ORGANISMS SEEN CYTOSPIN SMEAR    Culture   Final    NO GROWTH 2 DAYS Performed at [facility], [place] [address] [place], [place] [address]    Report Status PENDING  Incomplete    FURTHER DISCHARGE INSTRUCTIONS:  Get Medicines reviewed and adjusted: Please take all your medications with you for your next visit with your Primary MD  Laboratory/radiological data: Please request your Primary MD to go over all hospital tests and procedure/radiological results at the follow up, please ask your Primary MD to get all Hospital records sent to his/her office.  In some cases, they will be blood work, cultures and biopsy results pending at the time of your discharge. Please request that your primary care M.D. goes through all the records of your hospital data and follows up on these results.  Also Note the following: If you experience worsening of your admission symptoms, develop shortness of breath, life threatening emergency, suicidal or homicidal thoughts you must seek medical attention immediately by calling 911 or calling your MD immediately  if symptoms less severe.  You must read complete instructions/literature along with all the possible adverse reactions/side effects for all the Medicines you take and that have been prescribed to you. Take any new Medicines after you have completely understood and accpet all the possible adverse reactions/side effects.   Do not drive when taking Pain medications or sleeping medications (Benzodaizepines)  Do not take more than prescribed Pain, Sleep and Anxiety Medications. It is not advisable to combine anxiety,sleep and pain  medications without talking with your primary care practitioner  Special Instructions: If you have smoked or chewed Tobacco  in the last 2 yrs please stop smoking, stop any regular Alcohol  and or any Recreational drug use.  Wear Seat belts while driving.  Please note: You were cared for by a hospitalist during your hospital stay. Once you are discharged, your primary care physician will handle any further medical issues. Please note that NO REFILLS for any discharge medications will be authorized once you are discharged, as it is imperative that you return to your primary care physician (or establish a relationship with a primary care physician if you do not have one) for your post hospital discharge needs so that they can reassess your need for medications and monitor your lab values.  Total Time spent coordinating discharge including counseling, education and face to face time equals greater than 30 minutes.  Signed: Kobey Sides 04/05/2021 10:15 AM

## 2021-04-05 NOTE — Progress Notes (Signed)
?  [facility] ? ? ?Reason for visit: Follow up on meningitis ? ?Interval History: no fever overnight; WBC down to 11.2;  cultures all remain negative.   ? Remains off of antibotics  ?Had some headache overnight ? ?Physical Exam: ?Constitutional:  ?Vitals:  ? 04/05/21 0302 04/05/21 0743  ?BP: 112/69 (!) 135/94  ?Pulse: 90 95  ?Resp: 20 20  ?Temp: 99 ?F (37.2 ?C) 99.4 ?F (37.4 ?C)  ?SpO2: 95% 92%  ? patient appears in NAD ?Respiratory: Normal respiratory effort; CTA B ?Cardiovascular: RRR ? ? ?Review of Systems: ?Constitutional: negative for fevers and chills ?Neurological: negative for headaches and gait problems ? ?Lab Results  ?Component Value Date  ? WBC 11.2 (H) 04/05/2021  ? HGB 13.3 04/05/2021  ? HCT 39.8 04/05/2021  ? MCV 83.4 04/05/2021  ? PLT 314 04/05/2021  ?  ?Lab Results  ?Component Value Date  ? CREATININE 1.14 04/05/2021  ? BUN 12 04/05/2021  ? NA 139 04/05/2021  ? K 3.2 (L) 04/05/2021  ? CL 105 04/05/2021  ? CO2 26 04/05/2021  ?  ?Lab Results  ?Component Value Date  ? ALT 25 04/03/2021  ? AST 21 04/03/2021  ? ALKPHOS 72 04/03/2021  ?  ? ?Microbiology: ?Recent Results (from the past 240 hour(s))  ?Resp Panel by RT-PCR (Flu A&B, Covid) Nasopharyngeal Swab     Status: None  ? Collection Time: 04/03/21  2:10 AM  ? Specimen: Nasopharyngeal Swab; Nasopharyngeal(NP) swabs in vial transport medium  ?Result Value Ref Range Status  ? SARS Coronavirus 2 by RT PCR NEGATIVE NEGATIVE Final  ?  Comment: (NOTE) ?SARS-CoV-2 target nucleic acids are NOT DETECTED. ? ?The SARS-CoV-2 RNA is generally detectable in upper respiratory ?specimens during the acute phase of infection. The lowest ?concentration of SARS-CoV-2 viral copies this assay can detect is ?138 copies/mL. A negative result does not preclude SARS-Cov-2 ?infection and should not be used as the sole basis for treatment or ?other patient management decisions. A negative result may occur with  ?improper specimen collection/handling,  submission of specimen other ?than nasopharyngeal swab, presence of viral mutation(s) within the ?areas targeted by this assay, and inadequate number of viral ?copies(<138 copies/mL). A negative result must be combined with ?clinical observations, patient history, and epidemiological ?information. The expected result is Negative. ? ?Fact Sheet for Patients:  ?BloggerCourse.com ? ?Fact Sheet for Healthcare Providers:  ?SeriousBroker.it ? ?This test is no t yet approved or cleared by the [place] FDA and  ?has been authorized for detection and/or diagnosis of SARS-CoV-2 by ?FDA under an Emergency Use Authorization (EUA). This EUA will remain  ?in effect (meaning this test can be used) for the duration of the ?COVID-19 declaration under Section 564(b)(1) of the Act, 21 ?U.S.C.section 360bbb-3(b)(1), unless the authorization is terminated  ?or revoked sooner.  ? ? ?  ? Influenza A by PCR NEGATIVE NEGATIVE Final  ? Influenza B by PCR NEGATIVE NEGATIVE Final  ?  Comment: (NOTE) ?The Xpert Xpress SARS-CoV-2/FLU/RSV plus assay is intended as an aid ?in the diagnosis of influenza from Nasopharyngeal swab specimens and ?should not be used as a sole basis for treatment. Nasal washings and ?aspirates are unacceptable for Xpert Xpress SARS-CoV-2/FLU/RSV ?testing. ? ?Fact Sheet for Patients: ?BloggerCourse.com ? ?Fact Sheet for Healthcare Providers: ?SeriousBroker.it ? ?This test is not yet approved or cleared by the [place] FDA and ?has been authorized for detection and/or diagnosis of SARS-CoV-2 by ?FDA under an Emergency Use Authorization (EUA). This EUA  will remain ?in effect (meaning this test can be used) for the duration of the ?COVID-19 declaration under Section 564(b)(1) of the Act, 21 U.S.C. ?section 360bbb-3(b)(1), unless the authorization is terminated or ?revoked. ? ?Performed at [facility],  [address], ?[place], [place] [address] ?  ?Blood culture (routine x 2)     Status: None (Preliminary result)  ? Collection Time: 04/03/21  4:57 AM  ? Specimen: BLOOD  ?Result Value Ref Range Status  ? Specimen Description   Final  ?  BLOOD RIGHT FOREARM ?Performed at [facility], [address], [place], [place] [address] ?  ? Special Requests   Final  ?  BOTTLES DRAWN AEROBIC AND ANAEROBIC Blood Culture adequate volume ?Performed at [facility], [address], [place], [place] [address] ?  ? Culture   Final  ?  NO GROWTH 1 DAY ?Performed at [facility], [address] [place], [place] [address] ?  ? Report Status PENDING  Incomplete  ?Blood culture (routine x 2)     Status: None (Preliminary result)  ? Collection Time: 04/03/21  4:57 AM  ? Specimen: BLOOD  ?Result Value Ref Range Status  ? Specimen Description   Final  ?  BLOOD LEFT FOREARM ?Performed at [facility], [address], [place], [place] [address] ?  ? Special Requests   Final  ?  BOTTLES DRAWN AEROBIC AND ANAEROBIC Blood Culture adequate volume ?Performed at [facility], [address] [place], [place] [address] ?  ? Culture   Final  ?  NO GROWTH 1 DAY ?Performed at [facility], [address], [place], [place] [address] ?  ? Report Status PENDING  Incomplete  ?CSF culture w Gram Stain     Status: None (Preliminary result)  ? Collection Time: 04/03/21  1:44 PM  ? Specimen: PATH Cytology CSF; Cerebrospinal Fluid  ?Result Value Ref Range Status  ? Specimen Description CSF  Final  ? Special Requests NONE  Final  ? Gram Stain   Final  ?  WBC PRESENT,BOTH PMN AND MONONUCLEAR ?NO ORGANISMS SEEN ?CYTOSPIN SMEAR ?  ? Culture   Final  ?  NO GROWTH 2 DAYS ?Performed at [facility], [address] [place], [place] [address] ?  ? Report Status PENDING  Incomplete  ? ? ?Impression/Plan:  ?1. Viral meningitis - no positive cultures, + sick contact  with viral infection most c/w viral meningitis.  He is clinically improved and doing well.  No indication for further treatment. ?Continue supportive care. ? ?2.  AKI - creat up on admission and now wnl with creat of 1.14 today with fluid administration.  Resolved. ? ?3.  Headache - from #1 as above and stable now, no current headache and has responded to supportive therapy.  He may continue to have some headache as he recovers.   ? ?Ok from ID standpoint for discharge.   ? ?  ?

## 2021-04-06 LAB — CSF CULTURE W GRAM STAIN: Culture: NO GROWTH

## 2021-04-06 LAB — HSV 1/2 PCR, CSF
HSV-1 DNA: NEGATIVE
HSV-2 DNA: POSITIVE — AB

## 2021-04-06 NOTE — Progress Notes (Signed)
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 Comer-no need to treat. ?

## 2021-04-08 LAB — CULTURE, BLOOD (ROUTINE X 2)
Culture: NO GROWTH
Culture: NO GROWTH
Special Requests: ADEQUATE
Special Requests: ADEQUATE

## 2021-04-08 LAB — ANAEROBIC CULTURE W GRAM STAIN

## 2021-04-08 LAB — OLIGOCLONAL BANDS, CSF + SERM

## 2021-04-11 NOTE — Progress Notes (Signed)
?  ? ?I,Jordan Donovan,acting as a scribe for Lavon Paganini, MD.,have documented all relevant documentation on the behalf of Lavon Paganini, MD,as directed by  Lavon Paganini, MD while in the presence of Lavon Paganini, MD. ? ? ?Established patient visit ? ? ?Patient: Jordan Donovan   DOB: 04/26/1980   41 y.o. Male  MRN: [id number] ?Visit Date: 04/12/2021 ? ?Today's healthcare provider: Lavon Paganini, MD  ? ?Chief Complaint  ?Patient presents with  ? Hospitalization Follow-up  ? ?Subjective  ?  ?HPI  ?Follow up Hospitalization ? ?Patient was admitted to [facility] on 04/03/21 and discharged on 04/05/2021. ?He was treated for Suspected infectious meningitis. ?Telephone follow up was done on NA ?He reports excellent compliance with treatment. ?He reports this condition is resolved. ?Per Hospital note: ?"Please obtain CMP/CBC in one week ?Follow CSF cultures until final ?CSF HSV PCR, fungal cultures, cytology-pending-please follow.  cultures" ?----------------------------------------------------------------------------------------- -  ? ?Medications: ?Outpatient Medications Prior to Visit  ?Medication Sig  ? amLODipine (NORVASC) 10 MG tablet TAKE 1 TABLET(10 MG) BY MOUTH DAILY (Patient taking differently: Take 10 mg by mouth daily.)  ? cetirizine (ZYRTEC) 10 MG tablet Take 10 mg by mouth daily.  ? chlorthalidone (HYGROTON) 25 MG tablet TAKE 1 TABLET(25 MG) BY MOUTH DAILY (Patient taking differently: Take 25 mg by mouth daily.)  ? fluticasone (FLONASE) 50 MCG/ACT nasal spray Place 1 spray into both nostrils daily.  ? gabapentin (NEURONTIN) 300 MG capsule Take 1 capsule (300 mg total) by mouth 3 (three) times daily. (Patient taking differently: Take 300 mg by mouth 3 (three) times daily as needed (pain).)  ? Multiple Vitamin (MULTIVITAMIN) tablet Take 1 tablet by mouth daily.  ? naproxen (NAPROSYN) 500 MG tablet Take 500 mg by mouth 2 (two) times daily as needed for mild pain.  ? ?No facility-administered  medications prior to visit.  ? ? ?Review of Systems  ?Constitutional:  Negative for appetite change and fever.  ? ?Last CBC ?Lab Results  ?Component Value Date  ? WBC 11.2 (H) 04/05/2021  ? HGB 13.3 04/05/2021  ? HCT 39.8 04/05/2021  ? MCV 83.4 04/05/2021  ? MCH 27.9 04/05/2021  ? RDW 15.5 04/05/2021  ? PLT 314 04/05/2021  ? ?Last metabolic panel ?Lab Results  ?Component Value Date  ? GLUCOSE 110 (H) 04/05/2021  ? NA 139 04/05/2021  ? K 3.2 (L) 04/05/2021  ? CL 105 04/05/2021  ? CO2 26 04/05/2021  ? BUN 12 04/05/2021  ? CREATININE 1.14 04/05/2021  ? GFRNONAA >60 04/05/2021  ? CALCIUM 8.0 (L) 04/05/2021  ? PROT 7.2 04/03/2021  ? ALBUMIN 4.2 04/03/2021  ? LABGLOB 2.8 04/05/2020  ? AGRATIO 1.6 04/05/2020  ? BILITOT 0.3 04/03/2021  ? ALKPHOS 72 04/03/2021  ? AST 21 04/03/2021  ? ALT 25 04/03/2021  ? ANIONGAP 8 04/05/2021  ? ?  ?  Objective  ?  ?BP 133/87 (BP Location: Left Arm, Patient Position: Sitting, Cuff Size: Large)   Pulse 94   Temp 98.5 ?F (36.9 ?C) (Temporal)   Resp 16   Wt (!) 322 lb (146.1 kg)   BMI 38.18 kg/m?  ?BP Readings from Last 3 Encounters:  ?04/12/21 133/87  ?04/05/21 138/81  ?04/05/20 120/88  ? ?Wt Readings from Last 3 Encounters:  ?04/12/21 (!) 322 lb (146.1 kg)  ?04/02/21 (!) 320 lb (145.2 kg)  ?04/05/20 (!) 313 lb 11.2 oz (142.3 kg)  ? ?  ? ?Physical Exam ?Vitals reviewed.  ?Constitutional:   ?   General: He is  not in acute distress. ?   Appearance: Normal appearance. He is not diaphoretic.  ?HENT:  ?   Head: Normocephalic and atraumatic.  ?Eyes:  ?   General: No scleral icterus. ?   Conjunctiva/sclera: Conjunctivae normal.  ?Cardiovascular:  ?   Rate and Rhythm: Normal rate and regular rhythm.  ?   Pulses: Normal pulses.  ?   Heart sounds: Normal heart sounds. No murmur heard. ?Pulmonary:  ?   Effort: Pulmonary effort is normal. No respiratory distress.  ?   Breath sounds: Normal breath sounds. No wheezing or rhonchi.  ?Abdominal:  ?   General: There is no distension.  ?   Palpations:  Abdomen is soft.  ?   Tenderness: There is no abdominal tenderness.  ?Musculoskeletal:  ?   Cervical back: Neck supple.  ?   Right lower leg: No edema.  ?   Left lower leg: No edema.  ?Lymphadenopathy:  ?   Cervical: No cervical adenopathy.  ?Skin: ?   General: Skin is warm and dry.  ?   Capillary Refill: Capillary refill takes less than 2 seconds.  ?Neurological:  ?   Mental Status: He is alert and oriented to person, place, and time.  ?Psychiatric:     ?   Mood and Affect: Mood normal.     ?   Behavior: Behavior normal.  ?  ? ? ?No results found for any visits on 04/12/21. ? Assessment & Plan  ?  ? ?Problem List Items Addressed This Visit   ? ?  ? Cardiovascular and Mediastinum  ? Essential hypertension  ?  Well controlled ?Continue current medications ?Recheck metabolic panel ?  ?  ?  ? Genitourinary  ? Renal insufficiency  ?  Mild AKI while hospitalized ?Hydrating well ?Recheck metabolic panel ?  ?  ? Relevant Orders  ? Comprehensive metabolic panel  ?  ? Other  ? Obesity (BMI 30-39.9)  ?  Discussed importance of healthy weight management ?Discussed diet and exercise  ?  ?  ? Prediabetes  ?  Recommend low carb diet ?Recheck A1c ?  ?  ? Relevant Orders  ? Hemoglobin A1c  ? Mixed hyperlipidemia  ?  Reviewed last lipid panel ?Not currently on a statin ?Recheck FLP and CMP ?Discussed diet and exercise  ?  ?  ? Relevant Orders  ? Comprehensive metabolic panel  ? Lipid panel  ? Suspected infectious meningitis - Primary  ?  Much improved and asymptomatic  ?Cultures reviewed ?No further treatment indication ?Recheck CMP and CBC ?  ?  ? Relevant Orders  ? Comprehensive metabolic panel  ? CBC with Differential/Platelet  ? Hypokalemia  ?  Noted during hospitalization ?Not currently on supplement ?Recheck level ?  ?  ? Relevant Orders  ? Comprehensive metabolic panel  ?  ? ?Return in about 6 months (around 10/13/2021) for CPE.  ?   ? ?I, Lavon Paganini, MD, have reviewed all documentation for this visit. The documentation  on 04/12/21 for the exam, diagnosis, procedures, and orders are all accurate and complete. ? ? ?Virginia Crews, MD, MPH ?[facility]   ?

## 2021-04-12 ENCOUNTER — Other Ambulatory Visit: Payer: Self-pay

## 2021-04-12 ENCOUNTER — Ambulatory Visit: Payer: BC Managed Care – PPO | Admitting: Family Medicine

## 2021-04-12 ENCOUNTER — Encounter: Payer: Self-pay | Admitting: Family Medicine

## 2021-04-12 VITALS — BP 133/87 | HR 94 | Temp 98.5°F | Resp 16 | Wt 322.0 lb

## 2021-04-12 DIAGNOSIS — I10 Essential (primary) hypertension: Secondary | ICD-10-CM

## 2021-04-12 DIAGNOSIS — R7303 Prediabetes: Secondary | ICD-10-CM | POA: Diagnosis not present

## 2021-04-12 DIAGNOSIS — E876 Hypokalemia: Secondary | ICD-10-CM

## 2021-04-12 DIAGNOSIS — N289 Disorder of kidney and ureter, unspecified: Secondary | ICD-10-CM | POA: Diagnosis not present

## 2021-04-12 DIAGNOSIS — E669 Obesity, unspecified: Secondary | ICD-10-CM

## 2021-04-12 DIAGNOSIS — R29818 Other symptoms and signs involving the nervous system: Secondary | ICD-10-CM

## 2021-04-12 DIAGNOSIS — E782 Mixed hyperlipidemia: Secondary | ICD-10-CM

## 2021-04-12 NOTE — Assessment & Plan Note (Signed)
Noted during hospitalization ?Not currently on supplement ?Recheck level ?

## 2021-04-12 NOTE — Assessment & Plan Note (Signed)
Much improved and asymptomatic  ?Cultures reviewed ?No further treatment indication ?Recheck CMP and CBC ?

## 2021-04-12 NOTE — Assessment & Plan Note (Signed)
Well controlled Continue current medications Recheck metabolic panel 

## 2021-04-12 NOTE — Assessment & Plan Note (Signed)
Recommend low carb diet °Recheck A1c  °

## 2021-04-12 NOTE — Assessment & Plan Note (Signed)
Reviewed last lipid panel Not currently on a statin Recheck FLP and CMP Discussed diet and exercise  

## 2021-04-12 NOTE — Assessment & Plan Note (Signed)
Mild AKI while hospitalized ?Hydrating well ?Recheck metabolic panel ?

## 2021-04-12 NOTE — Assessment & Plan Note (Signed)
Discussed importance of healthy weight management Discussed diet and exercise  

## 2021-04-13 LAB — COMPREHENSIVE METABOLIC PANEL
ALT: 109 IU/L — ABNORMAL HIGH (ref 0–44)
AST: 55 IU/L — ABNORMAL HIGH (ref 0–40)
Albumin/Globulin Ratio: 1.6 (ref 1.2–2.2)
Albumin: 4.4 g/dL (ref 4.0–5.0)
Alkaline Phosphatase: 115 IU/L (ref 44–121)
BUN/Creatinine Ratio: 11 (ref 9–20)
BUN: 13 mg/dL (ref 6–24)
Bilirubin Total: 0.3 mg/dL (ref 0.0–1.2)
CO2: 30 mmol/L — ABNORMAL HIGH (ref 20–29)
Calcium: 9.7 mg/dL (ref 8.7–10.2)
Chloride: 99 mmol/L (ref 96–106)
Creatinine, Ser: 1.22 mg/dL (ref 0.76–1.27)
Globulin, Total: 2.7 g/dL (ref 1.5–4.5)
Glucose: 105 mg/dL — ABNORMAL HIGH (ref 70–99)
Potassium: 3.7 mmol/L (ref 3.5–5.2)
Sodium: 141 mmol/L (ref 134–144)
Total Protein: 7.1 g/dL (ref 6.0–8.5)
eGFR: 76 mL/min/{1.73_m2} (ref >59)

## 2021-04-13 LAB — LIPID PANEL
Chol/HDL Ratio: 4.5 ratio (ref 0.0–5.0)
Cholesterol, Total: 185 mg/dL (ref 100–199)
HDL: 41 mg/dL (ref >39)
LDL Chol Calc (NIH): 121 mg/dL — ABNORMAL HIGH (ref 0–99)
Triglycerides: 130 mg/dL (ref 0–149)
VLDL Cholesterol Cal: 23 mg/dL (ref 5–40)

## 2021-04-13 LAB — CBC WITH DIFFERENTIAL/PLATELET
Basophils Absolute: 0.1 10*3/uL (ref 0.0–0.2)
Basos: 1 %
EOS (ABSOLUTE): 0.1 10*3/uL (ref 0.0–0.4)
Eos: 2 %
Hematocrit: 43.8 % (ref 37.5–51.0)
Hemoglobin: 14.5 g/dL (ref 13.0–17.7)
Immature Grans (Abs): 0.1 10*3/uL (ref 0.0–0.1)
Immature Granulocytes: 1 %
Lymphocytes Absolute: 2.1 10*3/uL (ref 0.7–3.1)
Lymphs: 25 %
MCH: 27 pg (ref 26.6–33.0)
MCHC: 33.1 g/dL (ref 31.5–35.7)
MCV: 82 fL (ref 79–97)
Monocytes Absolute: 0.8 10*3/uL (ref 0.1–0.9)
Monocytes: 10 %
Neutrophils Absolute: 5.2 10*3/uL (ref 1.4–7.0)
Neutrophils: 61 %
Platelets: 400 10*3/uL (ref 150–450)
RBC: 5.37 x10E6/uL (ref 4.14–5.80)
RDW: 14.7 % (ref 11.6–15.4)
WBC: 8.4 10*3/uL (ref 3.4–10.8)

## 2021-04-13 LAB — HEMOGLOBIN A1C
Est. average glucose Bld gHb Est-mCnc: 137 mg/dL
Hgb A1c MFr Bld: 6.4 % — ABNORMAL HIGH (ref 4.8–5.6)

## 2021-04-15 ENCOUNTER — Other Ambulatory Visit: Payer: Self-pay

## 2021-04-15 DIAGNOSIS — I10 Essential (primary) hypertension: Secondary | ICD-10-CM

## 2021-04-26 DIAGNOSIS — I10 Essential (primary) hypertension: Secondary | ICD-10-CM | POA: Diagnosis not present

## 2021-04-27 LAB — HEPATIC FUNCTION PANEL
ALT: 35 IU/L (ref 0–44)
AST: 23 IU/L (ref 0–40)
Albumin: 4.5 g/dL (ref 4.0–5.0)
Alkaline Phosphatase: 84 IU/L (ref 44–121)
Bilirubin Total: 0.3 mg/dL (ref 0.0–1.2)
Bilirubin, Direct: 0.11 mg/dL (ref 0.00–0.40)
Total Protein: 7.2 g/dL (ref 6.0–8.5)

## 2021-05-01 LAB — FUNGUS CULTURE WITH STAIN

## 2021-05-01 LAB — FUNGUS CULTURE RESULT

## 2021-05-01 LAB — FUNGAL ORGANISM REFLEX

## 2021-07-01 ENCOUNTER — Other Ambulatory Visit: Payer: Self-pay | Admitting: Family Medicine

## 2021-07-01 DIAGNOSIS — I10 Essential (primary) hypertension: Secondary | ICD-10-CM

## 2021-07-01 NOTE — Telephone Encounter (Signed)
Medication Refill - Medication: chlorthalidone (HYGROTON) 25 MG tablet   amLODipine (NORVASC) 10 MG tablet  Has the patient contacted their pharmacy? Yes.   (Agent: If no, request that the patient contact the pharmacy for the refill. If patient does not wish to contact the pharmacy document the reason why and proceed with request.) (Agent: If yes, when and what did the pharmacy advise?)  Preferred Pharmacy (with phone number or street name):  [id number] [place], [place] - [address]  [address] [place] [address]  Phone: [phone] Fax: [phone]   Has the patient been seen for an appointment in the last year OR does the patient have an upcoming appointment? Yes.    Agent: Please be advised that RX refills may take up to 3 business days. We ask that you follow-up with your pharmacy.

## 2021-07-02 MED ORDER — CHLORTHALIDONE 25 MG PO TABS: 25.0000 mg | ORAL_TABLET | Freq: Every day | ORAL | 0 refills | Status: DC

## 2021-07-02 MED ORDER — AMLODIPINE BESYLATE 10 MG PO TABS: 10.0000 mg | ORAL_TABLET | Freq: Every day | ORAL | 0 refills | Status: DC

## 2021-07-02 NOTE — Telephone Encounter (Signed)
Requested Prescriptions  Pending Prescriptions Disp Refills  . amLODipine (NORVASC) 10 MG tablet 90 tablet 0    Sig: Take 1 tablet (10 mg total) by mouth daily.     Cardiovascular: Calcium Channel Blockers 2 Passed - 07/01/2021  4:08 PM      Passed - Last BP in normal range    BP Readings from Last 1 Encounters:  04/12/21 133/87         Passed - Last Heart Rate in normal range    Pulse Readings from Last 1 Encounters:  04/12/21 94         Passed - Valid encounter within last 6 months    Recent Outpatient Visits          2 months ago Suspected infectious meningitis   [facility] [place], Marzella Schlein, MD   1 year ago Essential hypertension   [facility], Marzella Schlein, MD   1 year ago Chronic bilateral low back pain with left-sided sciatica   [facility], Marzella Schlein, MD   1 year ago Encounter for annual physical exam   [facility] [place], Marzella Schlein, MD   2 years ago Essential hypertension   [facility] [place], Marzella Schlein, MD      Future Appointments            In 1 month [place], Marzella Schlein, MD [facility]           . chlorthalidone (HYGROTON) 25 MG tablet 90 tablet 0    Sig: Take 1 tablet (25 mg total) by mouth daily.     Cardiovascular: Diuretics - Thiazide Passed - 07/01/2021  4:08 PM      Passed - Cr in normal range and within 180 days    Creat  Date Value Ref Range Status  10/02/2016 1.15 0.60 - 1.35 mg/dL Final   Creatinine, Ser  Date Value Ref Range Status  04/12/2021 1.22 0.76 - 1.27 mg/dL Final         Passed - K in normal range and within 180 days    Potassium  Date Value Ref Range Status  04/12/2021 3.7 3.5 - 5.2 mmol/L Final         Passed - Na in normal range and within 180 days    Sodium  Date Value Ref Range Status  04/12/2021 141 134 - 144 mmol/L Final         Passed - Last BP in normal range    BP Readings from Last 1  Encounters:  04/12/21 133/87         Passed - Valid encounter within last 6 months    Recent Outpatient Visits          2 months ago Suspected infectious meningitis   [facility] [place], Marzella Schlein, MD   1 year ago Essential hypertension   [facility], Marzella Schlein, MD   1 year ago Chronic bilateral low back pain with left-sided sciatica   [facility] Marzella Schlein, MD   1 year ago Encounter for annual physical exam   [facility] [place], Marzella Schlein, MD   2 years ago Essential hypertension   [facility] [place], Marzella Schlein, MD      Future Appointments            In 1 month [place], Marzella Schlein, MD [facility]

## 2021-07-29 ENCOUNTER — Telehealth: Payer: Self-pay | Admitting: Family Medicine

## 2021-07-29 DIAGNOSIS — I10 Essential (primary) hypertension: Secondary | ICD-10-CM

## 2021-07-29 MED ORDER — CHLORTHALIDONE 25 MG PO TABS: 25.0000 mg | ORAL_TABLET | Freq: Every day | ORAL | 1 refills | Status: DC

## 2021-07-29 NOTE — Telephone Encounter (Signed)
[facility] faxed refill request for the following medications: ? ?chlorthalidone (HYGROTON) 25 MG tablet  ? ?Please advise. ? ?

## 2021-08-26 ENCOUNTER — Telehealth: Payer: Self-pay | Admitting: Family Medicine

## 2021-08-26 ENCOUNTER — Encounter: Payer: BC Managed Care – PPO | Admitting: Family Medicine

## 2021-08-26 NOTE — Telephone Encounter (Signed)
[facility] faxed refill request for the following medications:  amLODipine (NORVASC) 10 MG tablet   Please advise.

## 2021-08-26 NOTE — Telephone Encounter (Signed)
Called and checked with pt.  He does not need a refill at this time.

## 2021-10-03 ENCOUNTER — Other Ambulatory Visit: Payer: Self-pay | Admitting: Family Medicine

## 2021-10-03 DIAGNOSIS — I10 Essential (primary) hypertension: Secondary | ICD-10-CM

## 2021-10-22 ENCOUNTER — Ambulatory Visit (INDEPENDENT_AMBULATORY_CARE_PROVIDER_SITE_OTHER): Payer: BC Managed Care – PPO | Admitting: Family Medicine

## 2021-10-22 ENCOUNTER — Encounter: Payer: Self-pay | Admitting: Family Medicine

## 2021-10-22 VITALS — BP 133/89 | HR 69 | Temp 97.8°F | Resp 16 | Ht 77.0 in | Wt 325.0 lb

## 2021-10-22 DIAGNOSIS — G8929 Other chronic pain: Secondary | ICD-10-CM

## 2021-10-22 DIAGNOSIS — R7303 Prediabetes: Secondary | ICD-10-CM

## 2021-10-22 DIAGNOSIS — M5442 Lumbago with sciatica, left side: Secondary | ICD-10-CM

## 2021-10-22 DIAGNOSIS — E782 Mixed hyperlipidemia: Secondary | ICD-10-CM | POA: Diagnosis not present

## 2021-10-22 DIAGNOSIS — J309 Allergic rhinitis, unspecified: Secondary | ICD-10-CM

## 2021-10-22 DIAGNOSIS — I10 Essential (primary) hypertension: Secondary | ICD-10-CM | POA: Diagnosis not present

## 2021-10-22 DIAGNOSIS — Z0000 Encounter for general adult medical examination without abnormal findings: Secondary | ICD-10-CM | POA: Diagnosis not present

## 2021-10-22 DIAGNOSIS — E669 Obesity, unspecified: Secondary | ICD-10-CM

## 2021-10-22 MED ORDER — GABAPENTIN 300 MG PO CAPS: 300.0000 mg | ORAL_CAPSULE | Freq: Three times a day (TID) | ORAL | 3 refills | Status: DC | PRN

## 2021-10-22 NOTE — Progress Notes (Signed)
BP 133/89 (BP Location: Left Arm, Patient Position: Sitting, Cuff Size: Large)   Pulse 69   Temp 97.8 F (36.6 C) (Oral)   Resp 16   Ht 6\' 5"  (1.956 m)   Wt (!) 325 lb (147.4 kg)   BMI 38.54 kg/m    Subjective:    Patient ID: Jordan Donovan, male    DOB: 10/04/1980, 41 y.o.   MRN: [id number]  HPI: BRENNDEN THAKORE is a 41 y.o. male presenting on 10/22/2021 for comprehensive medical examination. Current medical complaints include:none  Hypertension: - Medications: amlodipine, chlorthalidone - Compliance: good - Checking BP at home: yes, 130s SBP - Denies any SOB, CP, vision changes, LE edema, medication SEs, or symptoms of hypotension  Back pain - gabapentin, naproxen prn with good effect.   Allergies - zyrtec and flonase with good effect.   Depression Screen done today and results listed below:     04/12/2021    9:26 AM 03/12/2020    1:55 PM 10/07/2019    1:52 PM 10/06/2018    2:51 PM 10/02/2016    9:37 AM  Depression screen PHQ 2/9  Decreased Interest 0 0 0 0 0  Down, Depressed, Hopeless 0 0 0 0 0  PHQ - 2 Score 0 0 0 0 0  Altered sleeping 0 0 0 0   Tired, decreased energy 0 0 0 0   Change in appetite 0 0 0 0   Feeling bad or failure about yourself  0 0 0 0   Trouble concentrating 0 0 0 0   Moving slowly or fidgety/restless 0 0 0    Suicidal thoughts 0 0 0 0   PHQ-9 Score 0 0 0 0   Difficult doing work/chores Not difficult at all Not difficult at all Not difficult at all Not difficult at all       Past Medical History:  Past Medical History:  Diagnosis Date   Degenerative disc disease, lumbar    Hypertension    Mixed hyperlipidemia    Obesity (BMI 30-39.9)     Surgical History:  Past Surgical History:  Procedure Laterality Date   Terrell    Medications:  Current Outpatient Medications on File Prior to Visit  Medication Sig   amLODipine (NORVASC) 10 MG  tablet Take 1 tablet (10 mg total) by mouth daily.   cetirizine (ZYRTEC) 10 MG tablet Take 10 mg by mouth daily.   chlorthalidone (HYGROTON) 25 MG tablet TAKE 1 TABLET (25 MG TOTAL) BY MOUTH DAILY.   fluticasone (FLONASE) 50 MCG/ACT nasal spray Place 1 spray into both nostrils daily.   Multiple Vitamin (MULTIVITAMIN) tablet Take 1 tablet by mouth daily.   naproxen (NAPROSYN) 500 MG tablet Take 500 mg by mouth 2 (two) times daily as needed for mild pain.   No current facility-administered medications on file prior to visit.    Allergies:  No Known Allergies  Social History:  Social History   Socioeconomic History   Marital status: Married    Spouse name: Not on file   Number of children: Not on file   Years of education: Not on file   Highest education level: Not on file  Occupational History   Not on file  Tobacco Use   Smoking status: Former    Types: Cigarettes    Quit date: 01/27/2005    Years since quitting: 78.7  Smokeless tobacco: Never  Substance and Sexual Activity   Alcohol use: No    Alcohol/week: 0.0 standard drinks of alcohol   Drug use: Never   Sexual activity: Not on file  Other Topics Concern   Not on file  Social History Narrative   Not on file   Social Determinants of Health   Financial Resource Strain: Not on file  Food Insecurity: Not on file  Transportation Needs: Not on file  Physical Activity: Not on file  Stress: Not on file  Social Connections: Not on file  Intimate Partner Violence: Not on file   Social History   Tobacco Use  Smoking Status Former   Types: Cigarettes   Quit date: 01/27/2005   Years since quitting: 16.7  Smokeless Tobacco Never   Social History   Substance and Sexual Activity  Alcohol Use No   Alcohol/week: 0.0 standard drinks of alcohol    Family History:  Family History  Problem Relation Age of Onset   Hypertension Mother    CVA Father    Hyperlipidemia Father    Healthy Brother    Healthy Daughter     Healthy Daughter    Healthy Son     Past medical history, surgical history, medications, allergies, family history and social history reviewed with patient today and changes made to appropriate areas of the chart.      Objective:    BP 133/89 (BP Location: Left Arm, Patient Position: Sitting, Cuff Size: Large)   Pulse 69   Temp 97.8 F (36.6 C) (Oral)   Resp 16   Ht 6\' 5"  (1.956 m)   Wt (!) 325 lb (147.4 kg)   BMI 38.54 kg/m   Wt Readings from Last 3 Encounters:  10/22/21 (!) 325 lb (147.4 kg)  04/12/21 (!) 322 lb (146.1 kg)  04/02/21 (!) 320 lb (145.2 kg)    Physical Exam Vitals reviewed.  Constitutional:      Appearance: Normal appearance.  HENT:     Head: Normocephalic.     Right Ear: Tympanic membrane, ear canal and external ear normal.     Left Ear: Tympanic membrane, ear canal and external ear normal.     Nose: Nose normal.     Mouth/Throat:     Mouth: Mucous membranes are moist.     Pharynx: Oropharynx is clear.  Eyes:     Extraocular Movements: Extraocular movements intact.     Pupils: Pupils are equal, round, and reactive to light.  Cardiovascular:     Rate and Rhythm: Normal rate and regular rhythm.     Heart sounds: Normal heart sounds. No murmur heard. Pulmonary:     Effort: Pulmonary effort is normal.     Breath sounds: Normal breath sounds.  Abdominal:     General: Bowel sounds are normal.     Palpations: Abdomen is soft.     Tenderness: There is no abdominal tenderness.  Musculoskeletal:        General: Normal range of motion.     Right lower leg: No edema.     Left lower leg: No edema.  Lymphadenopathy:     Cervical: No cervical adenopathy.  Skin:    General: Skin is warm and dry.  Neurological:     Mental Status: He is alert and oriented to person, place, and time. Mental status is at baseline.  Psychiatric:        Mood and Affect: Mood normal.        Behavior: Behavior normal.  Results for orders placed or performed in visit on  04/15/21  Hepatic function panel  Result Value Ref Range   Total Protein 7.2 6.0 - 8.5 g/dL   Albumin 4.5 4.0 - 5.0 g/dL   Bilirubin Total 0.3 0.0 - 1.2 mg/dL   Bilirubin, Direct 0.11 0.00 - 0.40 mg/dL   Alkaline Phosphatase 84 44 - 121 IU/L   AST 23 0 - 40 IU/L   ALT 35 0 - 44 IU/L      Assessment & Plan:   Problem List Items Addressed This Visit       Cardiovascular and Mediastinum   Essential hypertension    At goal. No med changes. Obtaining labs today.       Relevant Orders   Comprehensive metabolic panel     Respiratory   Allergic rhinitis     Nervous and Auditory   Chronic bilateral low back pain with left-sided sciatica   Relevant Medications   gabapentin (NEURONTIN) 300 MG capsule     Other   Mixed hyperlipidemia   Relevant Orders   Lipid Panel With LDL/HDL Ratio   Obesity (BMI 30-39.9)   Relevant Orders   Comprehensive metabolic panel   Lipid Panel With LDL/HDL Ratio   Hemoglobin A1c   Prediabetes   Relevant Orders   Hemoglobin A1c   Other Visit Diagnoses     Encounter for annual health examination    -  Primary   Relevant Orders   Comprehensive metabolic panel   Lipid Panel With LDL/HDL Ratio   Hemoglobin A1c   CBC with Differential        LABORATORY TESTING:  Health maintenance labs ordered today as discussed above.   The natural history of prostate cancer and ongoing controversy regarding screening and potential treatment outcomes of prostate cancer has been discussed with the patient. The meaning of a false positive PSA and a false negative PSA has been discussed. He indicates understanding of the limitations of this screening test and wishes to proceed with screening PSA testing.   IMMUNIZATIONS:   - Tdap: Tetanus vaccination status reviewed: last tetanus booster within 10 years. - Influenza: Refused - Pneumococcal: Not applicable - HPV: Not applicable - Shingrix vaccine: Not applicable - COVID vaccine: has received 3 doses of mRNA  vaccine  SCREENING: - Colonoscopy: Not applicable  Discussed with patient purpose of the colonoscopy is to detect colon cancer at curable precancerous or early stages   - AAA Screening: n/a - Lung cancer screening: n/a  Hep C Screening: UTD STD testing and prevention (HIV/chl/gon/syphilis): no concerns Sexual History: Incontinence Symptoms:   PATIENT COUNSELING:    Advanced Care Planning: A voluntary discussion about advance care planning including the explanation and discussion of advance directives.  Discussed health care proxy and Living will, and the patient was able to identify a health care proxy as wife, talis greenlief.  Patient does not have a living will at present time. If patient does have living will, I have requested they bring this to the clinic to be scanned in to their chart.  Sexuality: Discussed sexually transmitted diseases, partner selection, use of condoms, avoidance of unintended pregnancy  and contraceptive alternatives.   Advised to avoid cigarette smoking.  I discussed with the patient that most people either abstain from alcohol or drink within safe limits (<=14/week and <=4 drinks/occasion for males, <=7/weeks and <= 3 drinks/occasion for females) and that the risk for alcohol disorders and other health effects rises proportionally with the number of drinks  per week and how often a drinker exceeds daily limits.  Discussed cessation/primary prevention of drug use and availability of treatment for abuse.   Diet: Encouraged to adjust caloric intake to maintain  or achieve ideal body weight, to reduce intake of dietary saturated fat and total fat, to limit sodium intake by avoiding high sodium foods and not adding table salt, and to maintain adequate dietary potassium and calcium preferably from fresh fruits, vegetables, and low-fat dairy products.    Stressed the importance of regular exercise.  Injury prevention: Discussed safety belts, safety helmets, smoke  detector, smoking near bedding or upholstery.   Dental health: Discussed importance of regular tooth brushing, flossing, and dental visits.   Follow up plan: NEXT PREVENTATIVE PHYSICAL DUE IN 1 YEAR. Return in about 1 year (around 10/23/2022) for cpe.

## 2021-10-22 NOTE — Assessment & Plan Note (Signed)
At goal. No med changes. Obtaining labs today.

## 2021-10-23 ENCOUNTER — Other Ambulatory Visit: Payer: Self-pay

## 2021-10-23 DIAGNOSIS — E7800 Pure hypercholesterolemia, unspecified: Secondary | ICD-10-CM

## 2021-10-23 DIAGNOSIS — E782 Mixed hyperlipidemia: Secondary | ICD-10-CM

## 2021-10-23 LAB — COMPREHENSIVE METABOLIC PANEL
ALT: 25 IU/L (ref 0–44)
AST: 19 IU/L (ref 0–40)
Albumin/Globulin Ratio: 1.7 (ref 1.2–2.2)
Albumin: 4.5 g/dL (ref 4.1–5.1)
Alkaline Phosphatase: 89 IU/L (ref 44–121)
BUN/Creatinine Ratio: 12 (ref 9–20)
BUN: 14 mg/dL (ref 6–24)
Bilirubin Total: 0.4 mg/dL (ref 0.0–1.2)
CO2: 28 mmol/L (ref 20–29)
Calcium: 9.8 mg/dL (ref 8.7–10.2)
Chloride: 101 mmol/L (ref 96–106)
Creatinine, Ser: 1.18 mg/dL (ref 0.76–1.27)
Globulin, Total: 2.7 g/dL (ref 1.5–4.5)
Glucose: 91 mg/dL (ref 70–99)
Potassium: 3.9 mmol/L (ref 3.5–5.2)
Sodium: 144 mmol/L (ref 134–144)
Total Protein: 7.2 g/dL (ref 6.0–8.5)
eGFR: 79 mL/min/{1.73_m2} (ref >59)

## 2021-10-23 LAB — CBC WITH DIFFERENTIAL/PLATELET
Basophils Absolute: 0.1 10*3/uL (ref 0.0–0.2)
Basos: 1 %
EOS (ABSOLUTE): 0.1 10*3/uL (ref 0.0–0.4)
Eos: 1 %
Hematocrit: 42 % (ref 37.5–51.0)
Hemoglobin: 13.6 g/dL (ref 13.0–17.7)
Immature Grans (Abs): 0 10*3/uL (ref 0.0–0.1)
Immature Granulocytes: 0 %
Lymphocytes Absolute: 2.6 10*3/uL (ref 0.7–3.1)
Lymphs: 22 %
MCH: 27.1 pg (ref 26.6–33.0)
MCHC: 32.4 g/dL (ref 31.5–35.7)
MCV: 84 fL (ref 79–97)
Monocytes Absolute: 0.8 10*3/uL (ref 0.1–0.9)
Monocytes: 7 %
Neutrophils Absolute: 8.6 10*3/uL — ABNORMAL HIGH (ref 1.4–7.0)
Neutrophils: 69 %
Platelets: 401 10*3/uL (ref 150–450)
RBC: 5.02 x10E6/uL (ref 4.14–5.80)
RDW: 14.6 % (ref 11.6–15.4)
WBC: 12.2 10*3/uL — ABNORMAL HIGH (ref 3.4–10.8)

## 2021-10-23 LAB — HEMOGLOBIN A1C
Est. average glucose Bld gHb Est-mCnc: 140 mg/dL
Hgb A1c MFr Bld: 6.5 % — ABNORMAL HIGH (ref 4.8–5.6)

## 2021-10-23 LAB — LIPID PANEL WITH LDL/HDL RATIO
Cholesterol, Total: 214 mg/dL — ABNORMAL HIGH (ref 100–199)
HDL: 59 mg/dL (ref >39)
LDL Chol Calc (NIH): 138 mg/dL — ABNORMAL HIGH (ref 0–99)
LDL/HDL Ratio: 2.3 ratio (ref 0.0–3.6)
Triglycerides: 95 mg/dL (ref 0–149)
VLDL Cholesterol Cal: 17 mg/dL (ref 5–40)

## 2021-10-23 MED ORDER — ROSUVASTATIN CALCIUM 5 MG PO TABS: 5.0000 mg | ORAL_TABLET | Freq: Every day | ORAL | 3 refills | Status: DC

## 2022-01-05 ENCOUNTER — Other Ambulatory Visit: Payer: Self-pay | Admitting: Family Medicine

## 2022-01-05 DIAGNOSIS — I10 Essential (primary) hypertension: Secondary | ICD-10-CM

## 2022-02-03 DIAGNOSIS — M79645 Pain in left finger(s): Secondary | ICD-10-CM | POA: Diagnosis not present

## 2022-03-18 ENCOUNTER — Telehealth: Payer: Self-pay | Admitting: Family Medicine

## 2022-03-18 NOTE — Telephone Encounter (Signed)
[employer] FMLA paperwork was received on 03/18/2022 & placed in provider's mailbox. Please allow 7-10 buisness days to be completed. Thank you- Medical Records

## 2022-03-31 ENCOUNTER — Other Ambulatory Visit: Payer: Self-pay | Admitting: Family Medicine

## 2022-03-31 DIAGNOSIS — I10 Essential (primary) hypertension: Secondary | ICD-10-CM

## 2022-05-27 ENCOUNTER — Telehealth: Payer: Self-pay | Admitting: Family Medicine

## 2022-05-27 NOTE — Telephone Encounter (Signed)
  FMLA paperwork was received on 05/27/2022 & placed in provider's mailbox. Please allow 7-10 business days to be completed. Thank you   

## 2022-05-29 ENCOUNTER — Telehealth: Payer: Self-pay

## 2022-05-29 NOTE — Telephone Encounter (Signed)
Lmtcb to let us know if he needs FMLA forms filled out. Last ov 10/22/21. Advised he will need an appt and advised of charge for forms.

## 2022-05-29 NOTE — Telephone Encounter (Signed)
Copied from CRM [id number]. Topic: General - Other >> May 29, 2022  2:20 PM Dondra Prader A wrote: Reason for CRM: Please view TE from 05/29/22, pt called back stating that he does not need the FMLA forms filled out.

## 2022-07-13 ENCOUNTER — Other Ambulatory Visit: Payer: Self-pay | Admitting: Family Medicine

## 2022-07-13 DIAGNOSIS — I10 Essential (primary) hypertension: Secondary | ICD-10-CM

## 2022-07-22 NOTE — Telephone Encounter (Signed)
Needs appt to go over FMLA requirements and complete forms

## 2022-07-22 NOTE — Telephone Encounter (Signed)
Pt stated his FMLA expired on May 30th; he just spoke to [employer], and they will be faxing forms to the office today.  Please advise.

## 2022-07-23 ENCOUNTER — Telehealth: Payer: Self-pay | Admitting: Family Medicine

## 2022-07-23 NOTE — Telephone Encounter (Signed)
    Short-term Disability paperwork was received on 07/23/2022 & placed in provider's mailbox. Please allow 7-10 business days to be completed. Thank you

## 2022-08-12 ENCOUNTER — Encounter: Payer: Self-pay | Admitting: Family Medicine

## 2022-08-12 ENCOUNTER — Ambulatory Visit (INDEPENDENT_AMBULATORY_CARE_PROVIDER_SITE_OTHER): Payer: BC Managed Care – PPO | Admitting: Family Medicine

## 2022-08-12 VITALS — BP 123/82 | HR 89 | Temp 98.6°F | Resp 14 | Ht 77.0 in | Wt 330.0 lb

## 2022-08-12 DIAGNOSIS — M5442 Lumbago with sciatica, left side: Secondary | ICD-10-CM | POA: Diagnosis not present

## 2022-08-12 DIAGNOSIS — Z0289 Encounter for other administrative examinations: Secondary | ICD-10-CM

## 2022-08-12 DIAGNOSIS — G8929 Other chronic pain: Secondary | ICD-10-CM | POA: Diagnosis not present

## 2022-08-12 NOTE — Progress Notes (Signed)
   Established Patient Office Visit  Subjective   Patient ID: Jordan Donovan, male    DOB: November 08, 1980  Age: 42 y.o. MRN: [id number]  Chief Complaint  Patient presents with   encounter to complete FMLA form    HPI   Discussed the use of AI scribe software for clinical note transcription with the patient, who gave verbal consent to proceed.  History of Present Illness   The patient, with a history of degenerative disc disease, presents for completion of FMLA paperwork. They have been experiencing this chronic condition since 1997, with intermittent flares of back pain. The patient reports one to two episodes per month, each lasting one to two days. During these flares, they are unable to perform bending, twisting, or lifting activities. They have been managing their condition with gabapentin prn, naproxen as needed, and weekly chiropractic visits. The patient also mentions a recent improvement in their back pain since starting gym workouts. They also report a history of irritable bowel syndrome, which has been stable with no recent flares.         ROS per HPI    Objective:     BP 123/82 (BP Location: Right Arm, Patient Position: Sitting, Cuff Size: Large)   Pulse 89   Temp 98.6 F (37 C) (Oral)   Resp 14   Ht 6\' 5"  (1.956 m)   Wt (!) 330 lb (149.7 kg)   SpO2 95%   BMI 39.13 kg/m    Physical Exam Constitutional:      General: He is not in acute distress.    Appearance: Normal appearance. He is not diaphoretic.  HENT:     Head: Normocephalic.  Eyes:     Conjunctiva/sclera: Conjunctivae normal.  Pulmonary:     Effort: Pulmonary effort is normal. No respiratory distress.  Neurological:     Mental Status: He is alert and oriented to person, place, and time. Mental status is at baseline.      No results found for any visits on 08/12/22.    The 10-year ASCVD risk score (Arnett DK, et al., 2019) is: 5.5%    Assessment & Plan:   Problem List Items Addressed  This Visit       Nervous and Auditory   Chronic bilateral low back pain with left-sided sciatica - Primary   Other Visit Diagnoses     Encounter for completion of form with patient              Degenerative Disc Disease: Chronic condition since 1997 with intermittent flares. Currently managed with gabapentin daily and naproxen as needed. Patient also attends chiropractic sessions weekly and has started gym workouts. -Complete and submit FMLA and disability paperwork for intermittent leave due to condition. -Continue current management plan.  Hyperlipidemia: No recent changes or concerns. -Plan to check cholesterol levels at next physical on 10/27/22.  Irritable Bowel Syndrome: No recent flares, but patient reported recent transient abdominal pain that has since resolved. -Continue current management plan. -Advise patient to report if abdominal pain returns.        Return for as scheduled.   Total time spent on today's visit was greater than 25 minutes, including both face-to-face time and nonface-to-face time personally spent on review of chart, discussing further work-up, form completion, reviewing outside records of pertinent, answering patient's questions, and coordinating care.   Shirlee Latch, MD

## 2022-09-25 ENCOUNTER — Ambulatory Visit
Admission: RE | Admit: 2022-09-25 | Discharge: 2022-09-25 | Disposition: A | Discharge status: Home / Self Care | Payer: BC Managed Care – PPO | Source: Ambulatory Visit | Attending: Family Medicine | Admitting: Family Medicine

## 2022-09-25 ENCOUNTER — Encounter: Payer: Self-pay | Admitting: Family Medicine

## 2022-09-25 ENCOUNTER — Ambulatory Visit (INDEPENDENT_AMBULATORY_CARE_PROVIDER_SITE_OTHER): Payer: BC Managed Care – PPO | Admitting: Family Medicine

## 2022-09-25 VITALS — BP 128/84 | HR 95 | Ht 77.0 in | Wt 322.6 lb

## 2022-09-25 DIAGNOSIS — R102 Pelvic and perineal pain: Secondary | ICD-10-CM | POA: Diagnosis not present

## 2022-09-25 DIAGNOSIS — M25512 Pain in left shoulder: Secondary | ICD-10-CM | POA: Diagnosis not present

## 2022-09-25 DIAGNOSIS — S79912A Unspecified injury of left hip, initial encounter: Secondary | ICD-10-CM

## 2022-09-25 DIAGNOSIS — W19XXXA Unspecified fall, initial encounter: Secondary | ICD-10-CM | POA: Insufficient documentation

## 2022-09-25 DIAGNOSIS — M25552 Pain in left hip: Secondary | ICD-10-CM | POA: Insufficient documentation

## 2022-09-25 NOTE — Progress Notes (Signed)
[id number] 

## 2022-09-25 NOTE — Progress Notes (Signed)
   Acute Office Visit  Subjective:     Patient ID: Jordan Donovan, male    DOB: Apr 04, 1980, 42 y.o.   MRN: [id number]  Chief Complaint  Patient presents with   Medical Management of Chronic Issues    Lawnmower was being put on the back of a truck and upon lifting the lawnmower fell on top of patient hurting the whole left side, shoulder, hip and leg    HPI Discussed the use of AI scribe software for clinical note transcription with the patient, who gave verbal consent to proceed.  History of Present Illness   The patient presents after a fall from a lawnmower. He was loading a lawnmower onto a truck using ramps when the lawnmower tipped backwards, causing him to fall. The patient was able to kick the lawnmower away, preventing it from landing on him. He landed on grass with some gravel.  The patient reports tenderness in the left shoulder and hip, and a bruised left shin. He also hit his head during the fall but did not lose consciousness and has not experienced any changes in vision, headaches, nausea, or vomiting. He did have a slight headache upon waking up the morning after the fall, but it has since resolved.  The patient is able to walk and move his joints without significant pain, but he does report some pain when moving his left shoulder to a certain position. He has not noticed any bruising on his body.       ROS per HPI      Objective:    BP 128/84 (BP Location: Right Arm, Patient Position: Sitting, Cuff Size: Large)   Pulse 95   Ht 6\' 5"  (1.956 m)   Wt (!) 322 lb 9.6 oz (146.3 kg)   SpO2 97%   BMI 38.25 kg/m    Physical Exam  Physical Exam   MUSCULOSKELETAL: Shoulder tenderness to palpation, palpable knot, full range of motion. Hip tenderness, full range of motion. Shin abrasion, no tenderness. SKIN: No visible bruising on shoulder or back.       No results found for any visits on 09/25/22.      Assessment & Plan:   Problem List Items Addressed This  Visit   None Visit Diagnoses     Fall, initial encounter    -  Primary   Relevant Orders   DG Shoulder Left   DG Hip Unilat W OR W/O Pelvis 2-3 Views Left   Injury to joint of left side of pelvis, initial encounter       Relevant Orders   DG Hip Unilat W OR W/O Pelvis 2-3 Views Left   Acute pain of left shoulder       Relevant Orders   DG Shoulder Left           Fall with lawnmower Patient experienced a fall while loading a lawnmower onto a truck. No loss of consciousness, changes in vision, headaches, nausea, or vomiting reported. Tenderness in left shoulder, left hip, and left shin. No difficulty in walking. -Order X-rays of left shoulder, left hip, and low back to rule out fractures. -Advise patient to rest, ice the affected areas, and take over-the-counter pain relievers as needed. -Follow up with patient regarding X-ray results.        No orders of the defined types were placed in this encounter.   No follow-ups on file.  Shirlee Latch, MD

## 2022-10-27 ENCOUNTER — Encounter: Payer: BC Managed Care – PPO | Admitting: Family Medicine

## 2022-10-28 ENCOUNTER — Encounter: Payer: Self-pay | Admitting: Family Medicine

## 2022-10-28 ENCOUNTER — Ambulatory Visit (INDEPENDENT_AMBULATORY_CARE_PROVIDER_SITE_OTHER): Payer: BC Managed Care – PPO | Admitting: Family Medicine

## 2022-10-28 VITALS — BP 130/64 | HR 91 | Ht 77.0 in | Wt 329.8 lb

## 2022-10-28 DIAGNOSIS — R7303 Prediabetes: Secondary | ICD-10-CM | POA: Diagnosis not present

## 2022-10-28 DIAGNOSIS — E669 Obesity, unspecified: Secondary | ICD-10-CM

## 2022-10-28 DIAGNOSIS — Z0000 Encounter for general adult medical examination without abnormal findings: Secondary | ICD-10-CM

## 2022-10-28 DIAGNOSIS — E782 Mixed hyperlipidemia: Secondary | ICD-10-CM

## 2022-10-28 DIAGNOSIS — I10 Essential (primary) hypertension: Secondary | ICD-10-CM

## 2022-10-28 DIAGNOSIS — H6123 Impacted cerumen, bilateral: Secondary | ICD-10-CM

## 2022-10-28 NOTE — Assessment & Plan Note (Signed)
Discussed importance of healthy weight management Discussed diet and exercise  

## 2022-10-28 NOTE — Assessment & Plan Note (Signed)
Previous history of prediabetes. Patient reports lifestyle modifications including giving up sweet tea. -Check A1C with today's labs.

## 2022-10-28 NOTE — Progress Notes (Signed)
Complete physical exam  Patient: Jordan Donovan   DOB: 03/08/1980   42 y.o. Male  MRN: [id number]  Subjective:    Chief Complaint  Patient presents with   Annual Exam    No concerns today     Jordan Donovan is a 42 y.o. male who presents today for a complete physical exam. He reports consuming a general diet.  He generally feels well. He reports sleeping well. He does not have additional problems to discuss today.   Discussed the use of AI scribe software for clinical note transcription with the patient, who gave verbal consent to proceed.  History of Present Illness   The patient, with a history of hypertension and hyperlipidemia, presents for an annual physical. He reports overall good health and sleep, with no new or worsening symptoms. He mentions a chronic knee problem due to sports injuries from their youth, but do not express any current concerns about it. He had a brief period of sleep disturbance due to a broken air conditioner, but this issue has since been resolved. The patient also mentions a history of prediabetes and has made dietary changes, such as giving up sweet tea, to manage this condition.       Most recent fall risk assessment:    10/22/2021    2:01 PM  Fall Risk   Falls in the past year? 0  Number falls in past yr: 0  Injury with Fall? 0  Risk for fall due to : No Fall Risks  Follow up Falls evaluation completed     Most recent depression screenings:    04/12/2021    9:26 AM 03/12/2020    1:55 PM  PHQ 2/9 Scores  PHQ - 2 Score 0 0  PHQ- 9 Score 0 0        Patient Care Team: Erasmo Downer, MD as PCP - General (Family Medicine)   Outpatient Medications Prior to Visit  Medication Sig   amLODipine (NORVASC) 10 MG tablet TAKE 1 TABLET BY MOUTH EVERY DAY   cetirizine (ZYRTEC) 10 MG tablet Take 10 mg by mouth daily.   chlorthalidone (HYGROTON) 25 MG tablet TAKE 1 TABLET (25 MG TOTAL) BY MOUTH DAILY.   gabapentin (NEURONTIN) 300 MG  capsule Take 1 capsule (300 mg total) by mouth 3 (three) times daily as needed (pain).   Multiple Vitamin (MULTIVITAMIN) tablet Take 1 tablet by mouth daily.   naproxen (NAPROSYN) 500 MG tablet Take 500 mg by mouth 2 (two) times daily as needed for mild pain.   rosuvastatin (CRESTOR) 5 MG tablet Take 1 tablet (5 mg total) by mouth daily.   No facility-administered medications prior to visit.    ROS per HPI      Objective:     BP 130/64 (BP Location: Left Arm, Patient Position: Sitting, Cuff Size: Large)   Pulse 91   Ht 6\' 5"  (1.956 m)   Wt (!) 329 lb 12.8 oz (149.6 kg)   BMI 39.11 kg/m    Physical Exam Vitals reviewed.  Constitutional:      General: He is not in acute distress.    Appearance: Normal appearance. He is well-developed. He is not diaphoretic.  HENT:     Head: Normocephalic and atraumatic.     Right Ear: Ear canal and external ear normal. There is impacted cerumen.     Left Ear: Ear canal and external ear normal. There is impacted cerumen.     Nose: Nose normal.  Mouth/Throat:     Mouth: Mucous membranes are moist.     Pharynx: Oropharynx is clear. No oropharyngeal exudate.  Eyes:     General: No scleral icterus.    Conjunctiva/sclera: Conjunctivae normal.     Pupils: Pupils are equal, round, and reactive to light.  Neck:     Thyroid: No thyromegaly.  Cardiovascular:     Rate and Rhythm: Normal rate and regular rhythm.     Pulses: Normal pulses.     Heart sounds: Normal heart sounds. No murmur heard. Pulmonary:     Effort: Pulmonary effort is normal. No respiratory distress.     Breath sounds: Normal breath sounds. No wheezing or rales.  Abdominal:     General: There is no distension.     Palpations: Abdomen is soft.     Tenderness: There is no abdominal tenderness.  Musculoskeletal:        General: No deformity.     Cervical back: Neck supple.     Right lower leg: No edema.     Left lower leg: No edema.  Lymphadenopathy:     Cervical: No  cervical adenopathy.  Skin:    General: Skin is warm and dry.     Findings: No rash.  Neurological:     Mental Status: He is alert and oriented to person, place, and time. Mental status is at baseline.     Gait: Gait normal.  Psychiatric:        Mood and Affect: Mood normal.        Behavior: Behavior normal.        Thought Content: Thought content normal.      No results found for any visits on 10/28/22.     Assessment & Plan:    Routine Health Maintenance and Physical Exam  Immunization History  Administered Date(s) Administered   PFIZER(Purple Top)SARS-COV-2 Vaccination 04/01/2019, 04/16/2019, 12/18/2019   Tdap 10/02/2016    Health Maintenance  Topic Date Due   COVID-19 Vaccine (4 - 2023-24 season) 09/28/2022   INFLUENZA VACCINE  04/27/2023 (Originally 08/28/2022)   DTaP/Tdap/Td (2 - Td or Tdap) 10/03/2026   Hepatitis C Screening  Completed   HIV Screening  Completed   HPV VACCINES  Aged Out    Discussed health benefits of physical activity, and encouraged him to engage in regular exercise appropriate for his age and condition.  Problem List Items Addressed This Visit       Cardiovascular and Mediastinum   Essential hypertension    Initial blood pressure reading was slightly elevated, but improved on recheck. Currently managed on Chlorthalidone and Amlodipine. -Continue current medications. -Check blood pressure at next visit.      Relevant Orders   Comprehensive metabolic panel     Other   Obesity (BMI 30-39.9)    Discussed importance of healthy weight management Discussed diet and exercise       Prediabetes    Previous history of prediabetes. Patient reports lifestyle modifications including giving up sweet tea. -Check A1C with today's labs.      Relevant Orders   Hemoglobin A1c   Mixed hyperlipidemia    Currently managed on Crestor. -Continue Crestor. -Check cholesterol levels with today's labs.      Relevant Orders   Comprehensive metabolic  panel   Lipid panel   Other Visit Diagnoses     Encounter for annual physical exam    -  Primary   Relevant Orders   Hemoglobin A1c   Comprehensive metabolic panel  Lipid panel   Bilateral impacted cerumen               Knee Pain Chronic issue likely secondary to sports injuries. No new changes or concerns reported. -No changes to current management.  General Health Maintenance -Declined flu shot. -Tetanus immunization up to date until 2028. -Advised patient of upcoming colon cancer screening at age 65 (1.5 years from now). -Order non-fasting labs today including kidney and liver function, electrolytes, cholesterol, blood sugar, and A1C. -Schedule six-month follow-up appointment.       Return in about 6 months (around 04/28/2023) for chronic disease f/u.     Shirlee Latch, MD

## 2022-10-28 NOTE — Assessment & Plan Note (Signed)
Initial blood pressure reading was slightly elevated, but improved on recheck. Currently managed on Chlorthalidone and Amlodipine. -Continue current medications. -Check blood pressure at next visit.

## 2022-10-28 NOTE — Assessment & Plan Note (Signed)
Currently managed on Crestor. -Continue Crestor. -Check cholesterol levels with today's labs.

## 2022-10-29 ENCOUNTER — Encounter: Payer: Self-pay | Admitting: Family Medicine

## 2022-10-29 DIAGNOSIS — E1169 Type 2 diabetes mellitus with other specified complication: Secondary | ICD-10-CM

## 2022-10-29 LAB — COMPREHENSIVE METABOLIC PANEL
ALT: 26 [IU]/L (ref 0–44)
AST: 19 [IU]/L (ref 0–40)
Albumin: 4.4 g/dL (ref 4.1–5.1)
Alkaline Phosphatase: 119 [IU]/L (ref 44–121)
BUN/Creatinine Ratio: 10 (ref 9–20)
BUN: 12 mg/dL (ref 6–24)
Bilirubin Total: 0.2 mg/dL (ref 0.0–1.2)
CO2: 29 mmol/L (ref 20–29)
Calcium: 10 mg/dL (ref 8.7–10.2)
Chloride: 94 mmol/L — ABNORMAL LOW (ref 96–106)
Creatinine, Ser: 1.15 mg/dL (ref 0.76–1.27)
Globulin, Total: 3.1 g/dL (ref 1.5–4.5)
Glucose: 154 mg/dL — ABNORMAL HIGH (ref 70–99)
Potassium: 3.5 mmol/L (ref 3.5–5.2)
Sodium: 139 mmol/L (ref 134–144)
Total Protein: 7.5 g/dL (ref 6.0–8.5)
eGFR: 81 mL/min/{1.73_m2} (ref >59)

## 2022-10-29 LAB — HEMOGLOBIN A1C
Est. average glucose Bld gHb Est-mCnc: 140 mg/dL
Hgb A1c MFr Bld: 6.5 % — ABNORMAL HIGH (ref 4.8–5.6)

## 2022-10-29 LAB — LIPID PANEL
Chol/HDL Ratio: 3.3 {ratio} (ref 0.0–5.0)
Cholesterol, Total: 151 mg/dL (ref 100–199)
HDL: 46 mg/dL (ref >39)
LDL Chol Calc (NIH): 74 mg/dL (ref 0–99)
Triglycerides: 182 mg/dL — ABNORMAL HIGH (ref 0–149)
VLDL Cholesterol Cal: 31 mg/dL (ref 5–40)

## 2022-10-30 ENCOUNTER — Telehealth: Payer: Self-pay | Admitting: Family Medicine

## 2022-10-30 MED ORDER — TIRZEPATIDE 5 MG/0.5ML ~~LOC~~ SOAJ: 5.0000 mg | SUBCUTANEOUS | 1 refills | Status: DC

## 2022-10-30 MED ORDER — TIRZEPATIDE 2.5 MG/0.5ML ~~LOC~~ SOAJ: 2.5000 mg | SUBCUTANEOUS | 0 refills | Status: DC

## 2022-10-30 NOTE — Telephone Encounter (Signed)
Covermymeds is requesting prior authorization Key: [id number] Name: Chancelor Hardrick 2.5MG /0.5ML auto injectors

## 2022-11-05 NOTE — Telephone Encounter (Signed)
PA initiated

## 2022-11-05 NOTE — Telephone Encounter (Signed)
Covermymeds is waiting for Key: [id number] Name: Jordan Donovan 2.5MG /0.5ML auto injectors

## 2022-11-06 NOTE — Telephone Encounter (Signed)
Sent to [employer]

## 2022-11-07 NOTE — Telephone Encounter (Signed)
FYI- Status sent iconSent to Plan on October 10 [employer] The plan will fax you a determination, typically within 1 to 5 business days. Drug Mounjaro 2.5MG /0.5ML auto-injectors

## 2022-11-10 ENCOUNTER — Other Ambulatory Visit: Payer: Self-pay | Admitting: Family Medicine

## 2022-11-10 DIAGNOSIS — E7800 Pure hypercholesterolemia, unspecified: Secondary | ICD-10-CM

## 2022-11-10 DIAGNOSIS — I10 Essential (primary) hypertension: Secondary | ICD-10-CM

## 2022-11-10 DIAGNOSIS — E782 Mixed hyperlipidemia: Secondary | ICD-10-CM

## 2022-11-11 NOTE — Telephone Encounter (Signed)
Outcome Approved on October 13 by [facility] [employer] [facility] 2017 Approved. Please note: The allowed amount is 2 mL (4 pens) per 180 days. This medication is to be increased in strength every 30 days, as tolerated by the member, to a maintenance dose of up to 15mg /0.1mL (4 pens per 28 days). The program limit of 4 pens per 180 days of the 2.5mg /0.50mL strength allows for this dose increase. Authorization Expiration Date: 11/06/2023 Drug Mounjaro 2.5MG /0.5ML auto-injectors

## 2022-11-11 NOTE — Telephone Encounter (Signed)
Requested Prescriptions  Pending Prescriptions Disp Refills   amLODipine (NORVASC) 10 MG tablet [Pharmacy Med Name: AMLODIPINE BESYLATE 10 MG TAB] 90 tablet 1    Sig: TAKE 1 TABLET BY MOUTH EVERY DAY     Cardiovascular: Calcium Channel Blockers 2 Passed - 11/10/2022  5:04 PM      Passed - Last BP in normal range    BP Readings from Last 1 Encounters:  10/28/22 130/64         Passed - Last Heart Rate in normal range    Pulse Readings from Last 1 Encounters:  10/28/22 91         Passed - Valid encounter within last 6 months    Recent Outpatient Visits           2 weeks ago Encounter for annual physical exam   [facility] [place], Marzella Schlein, MD   1 month ago Fall, initial encounter   [facility] [place], Marzella Schlein, MD   3 months ago Chronic bilateral low back pain with left-sided sciatica   [facility] [place], Marzella Schlein, MD   1 year ago Encounter for annual health examination   [facility] Caro Laroche, DO   1 year ago Suspected infectious meningitis   [facility] [place], Marzella Schlein, MD       Future Appointments             In 2 months Bacigalupo, Marzella Schlein, MD [facility]   In 5 months Bacigalupo, Marzella Schlein, MD [facility]

## 2023-02-05 ENCOUNTER — Ambulatory Visit: Payer: BC Managed Care – PPO | Admitting: Family Medicine

## 2023-02-05 VITALS — BP 115/68 | HR 76 | Ht 76.0 in | Wt 307.0 lb

## 2023-02-05 DIAGNOSIS — E1169 Type 2 diabetes mellitus with other specified complication: Secondary | ICD-10-CM | POA: Diagnosis not present

## 2023-02-05 DIAGNOSIS — I152 Hypertension secondary to endocrine disorders: Secondary | ICD-10-CM

## 2023-02-05 DIAGNOSIS — Z23 Encounter for immunization: Secondary | ICD-10-CM

## 2023-02-05 DIAGNOSIS — E1159 Type 2 diabetes mellitus with other circulatory complications: Secondary | ICD-10-CM

## 2023-02-05 DIAGNOSIS — I10 Essential (primary) hypertension: Secondary | ICD-10-CM

## 2023-02-05 DIAGNOSIS — E785 Hyperlipidemia, unspecified: Secondary | ICD-10-CM

## 2023-02-05 DIAGNOSIS — E119 Type 2 diabetes mellitus without complications: Secondary | ICD-10-CM | POA: Insufficient documentation

## 2023-02-05 LAB — POCT GLYCOSYLATED HEMOGLOBIN (HGB A1C): Hemoglobin A1C: 5.8 % — AB (ref 4.0–5.6)

## 2023-02-05 MED ORDER — AMLODIPINE BESYLATE 10 MG PO TABS: 10.0000 mg | ORAL_TABLET | Freq: Every day | ORAL | 1 refills | Status: DC

## 2023-02-05 MED ORDER — CHLORTHALIDONE 25 MG PO TABS: 25.0000 mg | ORAL_TABLET | Freq: Every day | ORAL | 1 refills | Status: DC

## 2023-02-05 NOTE — Assessment & Plan Note (Signed)
 Chronic condition managed with amlodipine 10 mg daily and chlorthalidone 25 mg daily. No recent changes in medication or symptoms reported. - Refill amlodipine 10 mg daily - Continue chlorthalidone 25 mg daily

## 2023-02-05 NOTE — Progress Notes (Signed)
 Established patient visit   Patient: Jordan Donovan   DOB: 1980-02-21   42 y.o. Male  MRN: [id number] Visit Date: 02/05/2023  Today's healthcare provider: Jon Eva, MD   Chief Complaint  Patient presents with   Medical Management of Chronic Issues   Diabetes   Subjective    HPI HPI     Diabetes   Blurred vision: Absent.  Chest pain: Absent.  Fatigue: Absent.  Foot Ulcerations: Absent.  Nausea: Present.  Paresthesia of the feet: Absent.  Polydipsia: Absent.  Polyuria: Absent.  Visual changes: Absent.  Vomiting: Present.  Weight loss: Present.  Episodes of hypoglycemia: Absent.  Patient does not see a podiatrist.  The patient rarely exercises.      Last edited by Lilian Fitzpatrick, CMA on 02/05/2023  1:48 PM.       Discussed the use of AI scribe software for clinical note transcription with the patient, who gave verbal consent to proceed.  History of Present Illness   A 43 year old patient with recent onset type two diabetes, hypertension, and hyperlipidemia presents for a follow-up visit. The patient has been on amlodipine  10mg  daily, chlorthalidone  25mg  daily, Crestor  5mg  daily, and Mounjaro  2.5mg  weekly (then increased to 5mg  weekly). The patient reports significant weight loss of about 22 pounds in the past three months, which he attributes to the Mounjaro . He also reports experiencing gastrointestinal discomfort, including nausea and vomiting, particularly after increasing the dosage of Mounjaro . The patient has found that eating soup for a few days after taking Mounjaro  helps to manage the nausea. He also reports that he has been taking Pepto-Bismol to help manage the gastrointestinal discomfort.  In addition to the issues related to the Mounjaro , the patient recently suffered from norovirus, which caused significant gastrointestinal distress, including vomiting and diarrhea. The patient reports that he was unable to function for several days due to the illness.  He also expresses concern that the illness may have depleted his potassium levels.         Medications: Outpatient Medications Prior to Visit  Medication Sig   cetirizine (ZYRTEC) 10 MG tablet Take 10 mg by mouth daily.   gabapentin  (NEURONTIN ) 300 MG capsule Take 1 capsule (300 mg total) by mouth 3 (three) times daily as needed (pain).   Multiple Vitamin (MULTIVITAMIN) tablet Take 1 tablet by mouth daily.   naproxen (NAPROSYN) 500 MG tablet Take 500 mg by mouth 2 (two) times daily as needed for mild pain.   rosuvastatin  (CRESTOR ) 5 MG tablet TAKE 1 TABLET BY MOUTH DAILY   tirzepatide  (MOUNJARO ) 5 MG/0.5ML Pen Inject 5 mg into the skin once a week.   [DISCONTINUED] amLODipine  (NORVASC ) 10 MG tablet TAKE 1 TABLET BY MOUTH EVERY DAY   [DISCONTINUED] chlorthalidone  (HYGROTON ) 25 MG tablet TAKE 1 TABLET (25 MG TOTAL) BY MOUTH DAILY.   [DISCONTINUED] tirzepatide  (MOUNJARO ) 2.5 MG/0.5ML Pen Inject 2.5 mg into the skin once a week.   No facility-administered medications prior to visit.    Review of Systems     Objective    BP 115/68 (BP Location: Right Arm, Patient Position: Sitting, Cuff Size: Large)   Pulse 76   Ht 6' 4 (1.93 m)   Wt (!) 307 lb (139.3 kg)   SpO2 98%   BMI 37.37 kg/m    Physical Exam Vitals reviewed.  Constitutional:      General: He is not in acute distress.    Appearance: Normal appearance. He is not diaphoretic.  HENT:  Head: Normocephalic and atraumatic.  Eyes:     General: No scleral icterus.    Conjunctiva/sclera: Conjunctivae normal.  Cardiovascular:     Rate and Rhythm: Normal rate and regular rhythm.     Heart sounds: Normal heart sounds. No murmur heard. Pulmonary:     Effort: Pulmonary effort is normal. No respiratory distress.     Breath sounds: Normal breath sounds. No wheezing or rhonchi.  Musculoskeletal:     Cervical back: Neck supple.     Right lower leg: No edema.     Left lower leg: No edema.  Lymphadenopathy:     Cervical: No  cervical adenopathy.  Skin:    General: Skin is warm and dry.     Findings: No rash.  Neurological:     Mental Status: He is alert and oriented to person, place, and time. Mental status is at baseline.  Psychiatric:        Mood and Affect: Mood normal.        Behavior: Behavior normal.      No results found for any visits on 02/05/23.  Assessment & Plan     Problem List Items Addressed This Visit       Cardiovascular and Mediastinum   Hypertension associated with diabetes (HCC)   Chronic condition managed with amlodipine  10 mg daily and chlorthalidone  25 mg daily. No recent changes in medication or symptoms reported. - Refill amlodipine  10 mg daily - Continue chlorthalidone  25 mg daily      Relevant Medications   chlorthalidone  (HYGROTON ) 25 MG tablet   amLODipine  (NORVASC ) 10 MG tablet     Endocrine   Hyperlipidemia associated with type 2 diabetes mellitus (HCC)   Chronic condition managed with Crestor  5 mg daily. No recent changes in medication or symptoms reported. - Continue Crestor  5 mg daily      Relevant Medications   chlorthalidone  (HYGROTON ) 25 MG tablet   amLODipine  (NORVASC ) 10 MG tablet   Diabetes mellitus (HCC) - Primary   Recent onset of type 2 diabetes, progressed from prediabetes. Managed with Mounjaro  5 mg weekly after initial side effects (nausea) stabilized. Significant weight loss (22 pounds in 3 months). Awaiting A1c results to evaluate management efficacy. Discussed dietary monitoring, especially high-carb foods and portion sizes, to manage side effects and maintain weight loss. Informed consent obtained regarding gastrointestinal risks and benefits of weight loss and A1c control with Mounjaro . Patient prefers to maintain current dose if A1c < 7 to avoid further side effects. - Check A1c results - Continue Mounjaro  5 mg weekly - Monitor dietary intake, especially high-carb foods and portion sizes - Annual urine microalbumin test - Annual retinal  screening - Annual foot exam      Relevant Orders   POCT HgB A1C   Urine microalbumin-creatinine with uACR   Other Visit Diagnoses       Essential hypertension       Relevant Medications   chlorthalidone  (HYGROTON ) 25 MG tablet   amLODipine  (NORVASC ) 10 MG tablet     Need for pneumococcal vaccine       Relevant Orders   Pneumococcal conjugate vaccine 20-valent (Completed)     Immunization due       Relevant Orders   Pneumococcal conjugate vaccine 20-valent (Completed)           General Health Maintenance Discussed routine health maintenance for diabetes management, including vaccinations and screenings. Informed consent obtained for pneumonia vaccine, with patient preferring it due to past pneumonia. - Administer pneumonia  vaccine - Schedule next eye exam in May with retinal screening - Request eye exam report to primary care provider - Annual foot exam  Follow-up - Schedule follow-up in three months - Transition to six-month follow-up if stable.        Return in about 3 months (around 05/06/2023) for chronic disease f/u.       Jon Eva, MD  [facility] [phone] (phone) [phone] (fax)  [facility]

## 2023-02-05 NOTE — Assessment & Plan Note (Signed)
 Chronic condition managed with Crestor 5 mg daily. No recent changes in medication or symptoms reported. - Continue Crestor 5 mg daily

## 2023-02-05 NOTE — Assessment & Plan Note (Signed)
 Recent onset of type 2 diabetes, progressed from prediabetes. Managed with Mounjaro  5 mg weekly after initial side effects (nausea) stabilized. Significant weight loss (22 pounds in 3 months). Awaiting A1c results to evaluate management efficacy. Discussed dietary monitoring, especially high-carb foods and portion sizes, to manage side effects and maintain weight loss. Informed consent obtained regarding gastrointestinal risks and benefits of weight loss and A1c control with Mounjaro . Patient prefers to maintain current dose if A1c < 7 to avoid further side effects. - Check A1c results - Continue Mounjaro  5 mg weekly - Monitor dietary intake, especially high-carb foods and portion sizes - Annual urine microalbumin test - Annual retinal screening - Annual foot exam

## 2023-02-08 LAB — MICROALBUMIN / CREATININE URINE RATIO
Creatinine, Urine: 261.4 mg/dL
Microalb/Creat Ratio: 8 mg/g{creat} (ref 0–29)
Microalbumin, Urine: 20.3 ug/mL

## 2023-04-14 DIAGNOSIS — A499 Bacterial infection, unspecified: Secondary | ICD-10-CM | POA: Diagnosis not present

## 2023-04-28 ENCOUNTER — Ambulatory Visit: Payer: Self-pay | Admitting: Family Medicine

## 2023-05-07 ENCOUNTER — Encounter: Payer: Self-pay | Admitting: Family Medicine

## 2023-05-07 ENCOUNTER — Ambulatory Visit: Payer: Self-pay | Admitting: Family Medicine

## 2023-05-07 VITALS — BP 116/64 | HR 93 | Ht 76.0 in | Wt 303.6 lb

## 2023-05-07 DIAGNOSIS — Z7985 Long-term (current) use of injectable non-insulin antidiabetic drugs: Secondary | ICD-10-CM

## 2023-05-07 DIAGNOSIS — I152 Hypertension secondary to endocrine disorders: Secondary | ICD-10-CM | POA: Diagnosis not present

## 2023-05-07 DIAGNOSIS — J309 Allergic rhinitis, unspecified: Secondary | ICD-10-CM | POA: Diagnosis not present

## 2023-05-07 DIAGNOSIS — E1169 Type 2 diabetes mellitus with other specified complication: Secondary | ICD-10-CM

## 2023-05-07 DIAGNOSIS — E1159 Type 2 diabetes mellitus with other circulatory complications: Secondary | ICD-10-CM | POA: Diagnosis not present

## 2023-05-07 DIAGNOSIS — E785 Hyperlipidemia, unspecified: Secondary | ICD-10-CM | POA: Diagnosis not present

## 2023-05-07 LAB — POCT GLYCOSYLATED HEMOGLOBIN (HGB A1C): Hemoglobin A1C: 6.5 % — AB (ref 4.0–5.6)

## 2023-05-07 MED ORDER — FLUTICASONE PROPIONATE 50 MCG/ACT NA SUSP: 2.0000 | Freq: Every day | NASAL | 6 refills | Status: AC

## 2023-05-07 NOTE — Assessment & Plan Note (Signed)
 Sinus drainage leading to chest congestion. Currently taking Zyrtec but not using nasal sprays. Flonase recommended to address symptoms more effectively by treating at the source. - Prescribe Flonase nasal spray - Instruct on proper use of Flonase, including positioning and technique - Advise to use Flonase at night to reduce morning symptoms

## 2023-05-07 NOTE — Assessment & Plan Note (Signed)
 A1c increased from 5.8% to 6.5%, remaining under the target of 7%. Missed dose of Mounjaro led to recurrence of diarrhea and sulfur burps, but not nausea or vomiting. Experiencing weight loss at current dose of Mounjaro. Decision to maintain current dose to avoid exacerbating side effects. - Continue Mounjaro at 5 mg weekly - Recheck A1c at next visit - Order labs for kidney and liver function and cholesterol

## 2023-05-07 NOTE — Assessment & Plan Note (Signed)
 Managed with Crestor. No changes needed. - Continue Crestor 5 mg daily

## 2023-05-07 NOTE — Progress Notes (Signed)
 Established patient visit   Patient: Jordan Donovan   DOB: 1980/12/02   43 y.o. Male  MRN: [id number] Visit Date: 05/07/2023  Today's healthcare provider: Shirlee Latch, MD   Chief Complaint  Patient presents with   Follow-up   Diabetes    Pt doesn't check sugars at home, low carb/ low sugar diet, not able to exercise regularly due to lack of time No concerns   Subjective    Diabetes   HPI     Diabetes    Additional comments: Pt doesn't check sugars at home, low carb/ low sugar diet, not able to exercise regularly due to lack of time No concerns      Last edited by Allayne Stack on 05/07/2023  2:18 PM.       Discussed the use of AI scribe software for clinical note transcription with the patient, who gave verbal consent to proceed.  History of Present Illness   The patient, with a history of hypertension, hyperlipidemia, and diabetes, presents for a follow-up visit. He reports good control of his blood pressure and is pleased with his progress. However, he expresses disappointment with a recent increase in his HbA1c from 5.8 to 6.5, despite not making any significant changes in his lifestyle or diet. He reports a missed dose of his diabetes medication, Mounjaro, which led to a recurrence of side effects, including diarrhea and sulfur burps. He has since resumed his medication and is on his second dose since the missed one. He reports weight loss, with a 4-pound decrease since his last visit and a home scale reading of 295 pounds.  In addition to his diabetes, he reports sinus drainage, which he believes is due to allergies. He currently takes Zyrtec for this but has not used any nasal sprays. He also mentions a chest cold that developed after severe sinus drainage.         Medications: Outpatient Medications Prior to Visit  Medication Sig   amLODipine (NORVASC) 10 MG tablet Take 1 tablet (10 mg total) by mouth daily.   cetirizine (ZYRTEC) 10 MG tablet Take  10 mg by mouth daily.   chlorthalidone (HYGROTON) 25 MG tablet Take 1 tablet (25 mg total) by mouth daily.   gabapentin (NEURONTIN) 300 MG capsule Take 1 capsule (300 mg total) by mouth 3 (three) times daily as needed (pain).   Multiple Vitamin (MULTIVITAMIN) tablet Take 1 tablet by mouth daily.   naproxen (NAPROSYN) 500 MG tablet Take 500 mg by mouth 2 (two) times daily as needed for mild pain.   rosuvastatin (CRESTOR) 5 MG tablet TAKE 1 TABLET BY MOUTH DAILY   tirzepatide (MOUNJARO) 5 MG/0.5ML Pen Inject 5 mg into the skin once a week.   No facility-administered medications prior to visit.    Review of Systems     Objective    BP 116/64 (BP Location: Left Arm, Patient Position: Sitting, Cuff Size: Normal)   Pulse 93   Ht 6\' 4"  (1.93 m)   Wt (!) 303 lb 9.6 oz (137.7 kg)   SpO2 99%   BMI 36.96 kg/m    Physical Exam Vitals reviewed.  Constitutional:      General: He is not in acute distress.    Appearance: Normal appearance. He is not diaphoretic.  HENT:     Head: Normocephalic and atraumatic.  Eyes:     General: No scleral icterus.    Conjunctiva/sclera: Conjunctivae normal.  Cardiovascular:     Rate and Rhythm: Normal  rate and regular rhythm.     Heart sounds: Normal heart sounds. No murmur heard. Pulmonary:     Effort: Pulmonary effort is normal. No respiratory distress.     Breath sounds: Normal breath sounds. No wheezing or rhonchi.  Musculoskeletal:     Cervical back: Neck supple.     Right lower leg: No edema.     Left lower leg: No edema.  Lymphadenopathy:     Cervical: No cervical adenopathy.  Skin:    General: Skin is warm and dry.     Findings: No rash.  Neurological:     Mental Status: He is alert and oriented to person, place, and time. Mental status is at baseline.  Psychiatric:        Mood and Affect: Mood normal.        Behavior: Behavior normal.      Results for orders placed or performed in visit on 05/07/23  POCT HgB A1C  Result Value Ref  Range   Hemoglobin A1C 6.5 (A) 4.0 - 5.6 %    Assessment & Plan     Problem List Items Addressed This Visit       Cardiovascular and Mediastinum   Hypertension associated with diabetes (HCC)   Blood pressure well-controlled with current medication regimen. Takes medication at night to minimize daytime diuresis, which is effective. - Continue amlodipine 10 mg daily - Continue chlorthalidone 25 mg daily      Relevant Orders   Comprehensive metabolic panel with GFR     Respiratory   Allergic rhinitis   Sinus drainage leading to chest congestion. Currently taking Zyrtec but not using nasal sprays. Flonase recommended to address symptoms more effectively by treating at the source. - Prescribe Flonase nasal spray - Instruct on proper use of Flonase, including positioning and technique - Advise to use Flonase at night to reduce morning symptoms         Endocrine   Hyperlipidemia associated with type 2 diabetes mellitus (HCC)   Managed with Crestor. No changes needed. - Continue Crestor 5 mg daily      Relevant Orders   Comprehensive metabolic panel with GFR   Lipid panel   Diabetes mellitus (HCC) - Primary   A1c increased from 5.8% to 6.5%, remaining under the target of 7%. Missed dose of Mounjaro led to recurrence of diarrhea and sulfur burps, but not nausea or vomiting. Experiencing weight loss at current dose of Mounjaro. Decision to maintain current dose to avoid exacerbating side effects. - Continue Mounjaro at 5 mg weekly - Recheck A1c at next visit - Order labs for kidney and liver function and cholesterol      Relevant Orders   POCT HgB A1C (Completed)     Return in about 6 months (around 11/06/2023) for CPE.       Shirlee Latch, MD  [facility] [phone] (phone) [phone] (fax)  [facility]

## 2023-05-07 NOTE — Assessment & Plan Note (Signed)
 Blood pressure well-controlled with current medication regimen. Takes medication at night to minimize daytime diuresis, which is effective. - Continue amlodipine 10 mg daily - Continue chlorthalidone 25 mg daily

## 2023-05-08 ENCOUNTER — Encounter: Payer: Self-pay | Admitting: Family Medicine

## 2023-05-08 LAB — COMPREHENSIVE METABOLIC PANEL WITH GFR
ALT: 18 IU/L (ref 0–44)
AST: 17 IU/L (ref 0–40)
Albumin: 4.6 g/dL (ref 4.1–5.1)
Alkaline Phosphatase: 88 IU/L (ref 44–121)
BUN/Creatinine Ratio: 17 (ref 9–20)
BUN: 18 mg/dL (ref 6–24)
Bilirubin Total: 0.4 mg/dL (ref 0.0–1.2)
CO2: 26 mmol/L (ref 20–29)
Calcium: 9.6 mg/dL (ref 8.7–10.2)
Chloride: 97 mmol/L (ref 96–106)
Creatinine, Ser: 1.07 mg/dL (ref 0.76–1.27)
Globulin, Total: 2.6 g/dL (ref 1.5–4.5)
Glucose: 82 mg/dL (ref 70–99)
Potassium: 3.9 mmol/L (ref 3.5–5.2)
Sodium: 139 mmol/L (ref 134–144)
Total Protein: 7.2 g/dL (ref 6.0–8.5)
eGFR: 88 mL/min/{1.73_m2} (ref >59)

## 2023-05-08 LAB — LIPID PANEL
Chol/HDL Ratio: 2.9 ratio (ref 0.0–5.0)
Cholesterol, Total: 147 mg/dL (ref 100–199)
HDL: 50 mg/dL
LDL Chol Calc (NIH): 84 mg/dL (ref 0–99)
Triglycerides: 66 mg/dL (ref 0–149)
VLDL Cholesterol Cal: 13 mg/dL (ref 5–40)

## 2023-05-21 ENCOUNTER — Other Ambulatory Visit: Payer: Self-pay | Admitting: Family Medicine

## 2023-05-21 DIAGNOSIS — E1169 Type 2 diabetes mellitus with other specified complication: Secondary | ICD-10-CM

## 2023-05-21 NOTE — Telephone Encounter (Signed)
 Copied from CRM [id number]. Topic: Clinical - Medication Refill >> May 21, 2023  9:49 AM Emylou G wrote: Most Recent Primary Care Visit:  Provider: Mazie Speed  Department: [facility]  Visit Type: OFFICE VISIT  Date: 05/07/2023  Medication: tirzepatide  (MOUNJARO ) 5 MG/0.5ML Pen  Has the patient contacted their pharmacy? Yes (Agent: If no, request that the patient contact the pharmacy for the refill. If patient does not wish to contact the pharmacy document the reason why and proceed with request.) (Agent: If yes, when and what did the pharmacy advise?) said to contact us   Is this the correct pharmacy for this prescription? Yes If no, delete pharmacy and type the correct one.  This is the patient's preferred pharmacy:  [id number] Jonette Nestle, [place] - [address] [place] [address] Phone: [phone] Fax: [phone]    Has the prescription been filled recently? No  Is the patient out of the medication? Yes  Has the patient been seen for an appointment in the last year OR does the patient have an upcoming appointment? Yes  Can we respond through MyChart? Yes  Agent: Please be advised that Rx refills may take up to 3 business days. We ask that you follow-up with your pharmacy.

## 2023-05-22 MED ORDER — TIRZEPATIDE 5 MG/0.5ML ~~LOC~~ SOAJ: 5.0000 mg | SUBCUTANEOUS | 1 refills | Status: DC

## 2023-05-22 NOTE — Telephone Encounter (Signed)
 Requested medication (s) are due for refill today: yes  Requested medication (s) are on the active medication list: yes  Last refill:  10/30/22 #6/1  Future visit scheduled: yes  Notes to clinic:  Unable to refill per protocol, medication not assigned to the refill protocol.      Requested Prescriptions  Pending Prescriptions Disp Refills   tirzepatide  (MOUNJARO ) 5 MG/0.5ML Pen 6 mL 1    Sig: Inject 5 mg into the skin once a week.     Off-Protocol Failed - 05/22/2023  9:55 AM      Failed - Medication not assigned to a protocol, review manually.      Passed - Valid encounter within last 12 months    Recent Outpatient Visits           2 weeks ago Type 2 diabetes mellitus with other specified complication, without long-term current use of insulin [facility])   [place] [facility] [place], Stan Eans, MD       Future Appointments             In 5 months Bacigalupo, Stan Eans, MD [facility]

## 2023-08-05 ENCOUNTER — Other Ambulatory Visit: Payer: Self-pay | Admitting: Family Medicine

## 2023-08-05 DIAGNOSIS — I10 Essential (primary) hypertension: Secondary | ICD-10-CM

## 2023-08-15 ENCOUNTER — Other Ambulatory Visit: Payer: Self-pay | Admitting: Family Medicine

## 2023-08-15 DIAGNOSIS — I10 Essential (primary) hypertension: Secondary | ICD-10-CM

## 2023-08-17 ENCOUNTER — Other Ambulatory Visit: Payer: Self-pay | Admitting: Family Medicine

## 2023-08-17 DIAGNOSIS — E1169 Type 2 diabetes mellitus with other specified complication: Secondary | ICD-10-CM

## 2023-08-17 NOTE — Telephone Encounter (Signed)
 Requested Prescriptions  Pending Prescriptions Disp Refills   amLODipine  (NORVASC ) 10 MG tablet [Pharmacy Med Name: AMLODIPINE  BESYLATE 10 MG TAB] 90 tablet 0    Sig: TAKE 1 TABLET BY MOUTH EVERY DAY     Cardiovascular: Calcium  Channel Blockers 2 Passed - 08/17/2023  4:49 PM      Passed - Last BP in normal range    BP Readings from Last 1 Encounters:  05/07/23 116/64         Passed - Last Heart Rate in normal range    Pulse Readings from Last 1 Encounters:  05/07/23 93         Passed - Valid encounter within last 6 months    Recent Outpatient Visits           3 months ago Type 2 diabetes mellitus with other specified complication, without long-term current use of insulin (HCC)   [place] [facility] Bacigalupo, Jon HERO, MD       Future Appointments             In 2 months Bacigalupo, Jon HERO, MD [facility], [facility]

## 2023-08-19 NOTE — Telephone Encounter (Signed)
 Requested medication (s) are due for refill today: yes  Requested medication (s) are on the active medication list: yes  Last refill:  05/22/23  Future visit scheduled: yes  Notes to clinic:  Medication not assigned to a protocol, review manually.      Requested Prescriptions  Pending Prescriptions Disp Refills   MOUNJARO  5 MG/0.5ML Pen [Pharmacy Med Name: MOUNJARO  5 MG/0.5 ML PEN]  1    Sig: INJECT 5 MG SUBCUTANEOUSLY WEEKLY     Off-Protocol Failed - 08/19/2023  4:09 PM      Failed - Medication not assigned to a protocol, review manually.      Passed - Valid encounter within last 12 months    Recent Outpatient Visits           3 months ago Type 2 diabetes mellitus with other specified complication, without long-term current use of insulin [facility])   [place] [facility] [place], Jon HERO, MD       Future Appointments             In 2 months Bacigalupo, Jon HERO, MD [facility], [facility]

## 2023-09-16 LAB — HM DIABETES EYE EXAM

## 2023-09-17 ENCOUNTER — Encounter: Payer: Self-pay | Admitting: Family Medicine

## 2023-11-09 ENCOUNTER — Other Ambulatory Visit (HOSPITAL_COMMUNITY): Payer: Self-pay

## 2023-11-09 ENCOUNTER — Telehealth: Payer: Self-pay

## 2023-11-09 NOTE — Telephone Encounter (Signed)
 Pharmacy Patient Advocate Encounter   Received notification from Onbase that prior authorization for Mounjaro  5mg /0.73ml Pen is required/requested.   Insurance verification completed.   The patient is insured through [facility] [employer].   Per test claim: Refill too soon. PA is not needed at this time. Medication was filled 11/02/2023. Next eligible fill date is 11/30/2023.

## 2023-11-10 ENCOUNTER — Encounter: Payer: Self-pay | Admitting: Family Medicine

## 2023-11-10 ENCOUNTER — Ambulatory Visit: Admitting: Family Medicine

## 2023-11-10 VITALS — BP 127/79 | HR 86 | Ht 76.0 in | Wt 308.3 lb

## 2023-11-10 DIAGNOSIS — Z0001 Encounter for general adult medical examination with abnormal findings: Secondary | ICD-10-CM | POA: Diagnosis not present

## 2023-11-10 DIAGNOSIS — Z23 Encounter for immunization: Secondary | ICD-10-CM

## 2023-11-10 DIAGNOSIS — E1169 Type 2 diabetes mellitus with other specified complication: Secondary | ICD-10-CM

## 2023-11-10 DIAGNOSIS — E1159 Type 2 diabetes mellitus with other circulatory complications: Secondary | ICD-10-CM

## 2023-11-10 DIAGNOSIS — I152 Hypertension secondary to endocrine disorders: Secondary | ICD-10-CM | POA: Diagnosis not present

## 2023-11-10 DIAGNOSIS — E785 Hyperlipidemia, unspecified: Secondary | ICD-10-CM

## 2023-11-10 DIAGNOSIS — Z0000 Encounter for general adult medical examination without abnormal findings: Secondary | ICD-10-CM

## 2023-11-10 MED ORDER — TIRZEPATIDE 7.5 MG/0.5ML ~~LOC~~ SOAJ: 7.5000 mg | SUBCUTANEOUS | 1 refills | Status: AC

## 2023-11-10 NOTE — Progress Notes (Unsigned)
 Complete physical exam   Patient: Jordan Donovan   DOB: [birth year]   43 y.o. Male  MRN: [id number] Visit Date: 11/10/2023  Today's healthcare provider: Jon Eva, MD   Chief Complaint  Patient presents with   Annual Exam    Last completed 10/28/22 Diet -  low carb Exercise - walking and gym at least twice a week for at least an hour Feeling - well Sleeping - well Concerns - up dose of mounjaro    Subjective    Jordan Donovan is a 43 y.o. male who presents today for a complete physical exam.   Discussed the use of AI scribe software for clinical note transcription with the patient, who gave verbal consent to proceed.  History of Present Illness   Jordan Donovan is a 43 year old male with hypertension and obesity who presents for a follow-up visit.  He experienced a recent episode of disorientation and a fall after standing up too quickly from a supine position. He did not lose consciousness but felt as though he might. He sustained a minor knee injury but no serious harm. He recalls a similar incident years ago and has learned to avoid standing up too quickly. No frequent episodes of blood pressure dropping or feeling faint when standing up, except when standing too quickly.  He is currently on amlodipine  10 mg and chlorthalidone  25 mg daily. He is also on rosuvastatin  5 mg daily.  He has been using Mounjaro  for weight management and his weight has plateaued after an initial loss. He is currently around 300 pounds, fluctuating between 295 and 300 pounds, having previously reached 290 pounds. He has managed the side effects of Mounjaro . His current dose is 5 mg weekly.        Last depression screening scores    11/10/2023    2:20 PM 05/07/2023    2:25 PM 02/05/2023    5:02 PM  PHQ 2/9 Scores  PHQ - 2 Score 0 0 0   Last fall risk screening    05/07/2023    2:25 PM  Fall Risk   Falls in the past year? 0  Number falls in past yr: 0  Injury  with Fall? 0        Medications: Outpatient Medications Prior to Visit  Medication Sig   amLODipine  (NORVASC ) 10 MG tablet TAKE 1 TABLET BY MOUTH EVERY DAY   cetirizine (ZYRTEC) 10 MG tablet Take 10 mg by mouth daily.   chlorthalidone  (HYGROTON ) 25 MG tablet TAKE 1 TABLET (25 MG TOTAL) BY MOUTH DAILY.   fluticasone  (FLONASE ) 50 MCG/ACT nasal spray Place 2 sprays into both nostrils daily.   gabapentin  (NEURONTIN ) 300 MG capsule Take 1 capsule (300 mg total) by mouth 3 (three) times daily as needed (pain).   Multiple Vitamin (MULTIVITAMIN) tablet Take 1 tablet by mouth daily.   naproxen (NAPROSYN) 500 MG tablet Take 500 mg by mouth 2 (two) times daily as needed for mild pain.   rosuvastatin  (CRESTOR ) 5 MG tablet TAKE 1 TABLET BY MOUTH DAILY   [DISCONTINUED] tirzepatide  (MOUNJARO ) 5 MG/0.5ML Pen INJECT 5 MG SUBCUTANEOUSLY WEEKLY   No facility-administered medications prior to visit.    Review of Systems    Objective    BP 127/79 (BP Location: Right Arm, Patient Position: Sitting, Cuff Size: Normal)   Pulse 86   Ht 6' 4 (1.93 m)   Wt (!) 308 lb 4.8 oz (139.8 kg)   SpO2 96%   BMI  37.53 kg/m    Physical Exam Vitals reviewed.  Constitutional:      General: He is not in acute distress.    Appearance: Normal appearance. He is well-developed. He is not diaphoretic.  HENT:     Head: Normocephalic and atraumatic.     Right Ear: Tympanic membrane, ear canal and external ear normal.     Left Ear: Tympanic membrane, ear canal and external ear normal.     Nose: Nose normal.     Mouth/Throat:     Mouth: Mucous membranes are moist.     Pharynx: Oropharynx is clear. No oropharyngeal exudate.  Eyes:     General: No scleral icterus.    Conjunctiva/sclera: Conjunctivae normal.     Pupils: Pupils are equal, round, and reactive to light.  Neck:     Thyroid: No thyromegaly.  Cardiovascular:     Rate and Rhythm: Normal rate and regular rhythm.     Heart sounds: Normal heart sounds.  No murmur heard. Pulmonary:     Effort: Pulmonary effort is normal. No respiratory distress.     Breath sounds: Normal breath sounds. No wheezing or rales.  Abdominal:     General: There is no distension.     Palpations: Abdomen is soft.     Tenderness: There is no abdominal tenderness.  Musculoskeletal:        General: No deformity.     Cervical back: Neck supple.     Right lower leg: No edema.     Left lower leg: No edema.  Lymphadenopathy:     Cervical: No cervical adenopathy.  Skin:    General: Skin is warm and dry.     Findings: No rash.  Neurological:     Mental Status: He is alert and oriented to person, place, and time. Mental status is at baseline.     Gait: Gait normal.  Psychiatric:        Mood and Affect: Mood normal.        Behavior: Behavior normal.        Thought Content: Thought content normal.      Results for orders placed or performed in visit on 11/10/23  Hemoglobin A1c  Result Value Ref Range   Hgb A1c MFr Bld 6.1 (H) 4.8 - 5.6 %   Est. average glucose Bld gHb Est-mCnc 128 mg/dL  Comprehensive metabolic panel with GFR  Result Value Ref Range   Glucose 109 (H) 70 - 99 mg/dL   BUN 19 6 - 24 mg/dL   Creatinine, Ser 8.86 0.76 - 1.27 mg/dL   eGFR 82 >40 fO/fpw/8.26   BUN/Creatinine Ratio 17 9 - 20   Sodium 140 134 - 144 mmol/L   Potassium 3.5 3.5 - 5.2 mmol/L   Chloride 97 96 - 106 mmol/L   CO2 28 20 - 29 mmol/L   Calcium  9.7 8.7 - 10.2 mg/dL   Total Protein 7.3 6.0 - 8.5 g/dL   Albumin 4.5 4.1 - 5.1 g/dL   Globulin, Total 2.8 1.5 - 4.5 g/dL   Bilirubin Total 0.4 0.0 - 1.2 mg/dL   Alkaline Phosphatase 94 47 - 123 IU/L   AST 20 0 - 40 IU/L   ALT 27 0 - 44 IU/L  Lipid panel  Result Value Ref Range   Cholesterol, Total 165 100 - 199 mg/dL   Triglycerides 885 0 - 149 mg/dL   HDL 59 >60 mg/dL   VLDL Cholesterol Cal 20 5 - 40 mg/dL   LDL Chol Calc (  NIH) 86 0 - 99 mg/dL   Chol/HDL Ratio 2.8 0.0 - 5.0 ratio    Assessment & Plan    Routine Health  Maintenance and Physical Exam  Exercise Activities and Dietary recommendations  Goals   None     Immunization History  Administered Date(s) Administered   Hepb-cpg 11/10/2023   PFIZER(Purple Top)SARS-COV-2 Vaccination 04/01/2019, 04/16/2019, 12/18/2019   PNEUMOCOCCAL CONJUGATE-20 02/05/2023   Tdap 10/02/2016    Health Maintenance  Topic Date Due   HPV VACCINES (1 - 3-dose SCDM series) Never done   COVID-19 Vaccine (4 - 2025-26 season) 09/28/2023   Influenza Vaccine  04/26/2024 (Originally 08/28/2023)   Hepatitis B Vaccines 19-59 Average Risk (2 of 2 - CpG 2-dose series) 12/08/2023   Diabetic kidney evaluation - Urine ACR  02/05/2024   FOOT EXAM  02/05/2024   HEMOGLOBIN A1C  05/10/2024   OPHTHALMOLOGY EXAM  09/15/2024   Diabetic kidney evaluation - eGFR measurement  11/09/2024   DTaP/Tdap/Td (2 - Td or Tdap) 10/03/2026   Pneumococcal Vaccine  Completed   Hepatitis C Screening  Completed   HIV Screening  Completed   Meningococcal B Vaccine  Aged Out    Discussed health benefits of physical activity, and encouraged him to engage in regular exercise appropriate for his age and condition.  Problem List Items Addressed This Visit       Cardiovascular and Mediastinum   Hypertension associated with diabetes (HCC)   Hypertension well-controlled with current medication regimen. Blood pressure has been lower with weight loss. Current medications include amlodipine  10 mg and chlorthalidone  25 mg daily.      Relevant Medications   tirzepatide  (MOUNJARO ) 7.5 MG/0.5ML Pen   Other Relevant Orders   Comprehensive metabolic panel with GFR (Completed)     Endocrine   Hyperlipidemia associated with type 2 diabetes mellitus (HCC)   Hyperlipidemia previously improved with weight loss. Currently managed with rosuvastatin  5 mg daily.      Relevant Medications   tirzepatide  (MOUNJARO ) 7.5 MG/0.5ML Pen   Other Relevant Orders   Comprehensive metabolic panel with GFR (Completed)   Lipid  panel (Completed)   Diabetes mellitus (HCC)   Type 2 diabetes mellitus with previous improvement in blood glucose levels. Current management includes Mounjaro , which aids in blood sugar control. - Check A1c today. - Increase Mounjaro  to 7.5 mg weekly to support blood sugar control.      Relevant Medications   tirzepatide  (MOUNJARO ) 7.5 MG/0.5ML Pen   Other Relevant Orders   Hemoglobin A1c (Completed)     Other   Morbid obesity (HCC)   BMI 37 and assoc with HTN, DM, HLD Obesity with previous significant weight loss, currently plateaued at 295-300 lbs. Previous weight loss attributed to Mounjaro  5 mg, considering increase to 7.5 mg to address plateau. Side effects are under control. - Increase Mounjaro  to 7.5 mg weekly to address weight plateau and support further weight loss. - Monitor for side effects with increased dose.      Relevant Medications   tirzepatide  (MOUNJARO ) 7.5 MG/0.5ML Pen   Other Relevant Orders   Hemoglobin A1c (Completed)   Comprehensive metabolic panel with GFR (Completed)   Lipid panel (Completed)   Other Visit Diagnoses       Encounter for annual physical exam    -  Primary   Relevant Orders   Hemoglobin A1c (Completed)   Comprehensive metabolic panel with GFR (Completed)   Lipid panel (Completed)           General Health  Maintenance General health maintenance discussed, including vaccinations and screenings. Hepatitis B vaccination recommended as he has not received it before. Tetanus vaccination up to date for three more years. Pneumonia vaccination to be repeated after age 7. Colon cancer screening to start at age 89 due to previous scare and diagnosis of irritable bowel syndrome. - Administer first dose of Hepatitis B vaccine today, with second dose in one month. - Schedule colonoscopy for next year for colon cancer screening.       Return in about 6 months (around 05/10/2024) for chronic disease f/u and 1 m nurse visit for 2nd heplisav.      Jon Eva, MD  [facility] [phone] (phone) [phone] (fax)  [facility]

## 2023-11-11 ENCOUNTER — Ambulatory Visit: Payer: Self-pay | Admitting: Family Medicine

## 2023-11-11 DIAGNOSIS — E1169 Type 2 diabetes mellitus with other specified complication: Secondary | ICD-10-CM

## 2023-11-11 LAB — LIPID PANEL
Chol/HDL Ratio: 2.8 ratio (ref 0.0–5.0)
Cholesterol, Total: 165 mg/dL (ref 100–199)
HDL: 59 mg/dL (ref >39)
LDL Chol Calc (NIH): 86 mg/dL (ref 0–99)
Triglycerides: 114 mg/dL (ref 0–149)
VLDL Cholesterol Cal: 20 mg/dL (ref 5–40)

## 2023-11-11 LAB — COMPREHENSIVE METABOLIC PANEL WITH GFR
ALT: 27 IU/L (ref 0–44)
AST: 20 IU/L (ref 0–40)
Albumin: 4.5 g/dL (ref 4.1–5.1)
Alkaline Phosphatase: 94 IU/L (ref 47–123)
BUN/Creatinine Ratio: 17 (ref 9–20)
BUN: 19 mg/dL (ref 6–24)
Bilirubin Total: 0.4 mg/dL (ref 0.0–1.2)
CO2: 28 mmol/L (ref 20–29)
Calcium: 9.7 mg/dL (ref 8.7–10.2)
Chloride: 97 mmol/L (ref 96–106)
Creatinine, Ser: 1.13 mg/dL (ref 0.76–1.27)
Globulin, Total: 2.8 g/dL (ref 1.5–4.5)
Glucose: 109 mg/dL — ABNORMAL HIGH (ref 70–99)
Potassium: 3.5 mmol/L (ref 3.5–5.2)
Sodium: 140 mmol/L (ref 134–144)
Total Protein: 7.3 g/dL (ref 6.0–8.5)
eGFR: 82 mL/min/1.73 (ref >59)

## 2023-11-11 LAB — HEMOGLOBIN A1C
Est. average glucose Bld gHb Est-mCnc: 128 mg/dL
Hgb A1c MFr Bld: 6.1 % — ABNORMAL HIGH (ref 4.8–5.6)

## 2023-11-11 NOTE — Assessment & Plan Note (Signed)
 BMI 37 and assoc with HTN, DM, HLD Obesity with previous significant weight loss, currently plateaued at 295-300 lbs. Previous weight loss attributed to Mounjaro  5 mg, considering increase to 7.5 mg to address plateau. Side effects are under control. - Increase Mounjaro  to 7.5 mg weekly to address weight plateau and support further weight loss. - Monitor for side effects with increased dose.

## 2023-11-11 NOTE — Assessment & Plan Note (Signed)
 Type 2 diabetes mellitus with previous improvement in blood glucose levels. Current management includes Mounjaro , which aids in blood sugar control. - Check A1c today. - Increase Mounjaro  to 7.5 mg weekly to support blood sugar control.

## 2023-11-11 NOTE — Assessment & Plan Note (Signed)
 Hypertension well-controlled with current medication regimen. Blood pressure has been lower with weight loss. Current medications include amlodipine  10 mg and chlorthalidone  25 mg daily.

## 2023-11-11 NOTE — Assessment & Plan Note (Signed)
 Hyperlipidemia previously improved with weight loss. Currently managed with rosuvastatin  5 mg daily.

## 2023-11-12 MED ORDER — ROSUVASTATIN CALCIUM 10 MG PO TABS: 10.0000 mg | ORAL_TABLET | Freq: Every day | ORAL | 1 refills | Status: AC

## 2023-11-24 ENCOUNTER — Other Ambulatory Visit: Payer: Self-pay | Admitting: Family Medicine

## 2023-11-24 DIAGNOSIS — I10 Essential (primary) hypertension: Secondary | ICD-10-CM

## 2023-12-15 ENCOUNTER — Encounter: Payer: Self-pay | Admitting: Family Medicine

## 2023-12-15 ENCOUNTER — Ambulatory Visit
Admission: RE | Admit: 2023-12-15 | Discharge: 2023-12-15 | Disposition: A | Discharge status: Home / Self Care | Source: Ambulatory Visit | Attending: Family Medicine | Admitting: Family Medicine

## 2023-12-15 ENCOUNTER — Ambulatory Visit: Admitting: Family Medicine

## 2023-12-15 ENCOUNTER — Ambulatory Visit (INDEPENDENT_AMBULATORY_CARE_PROVIDER_SITE_OTHER): Admitting: Family Medicine

## 2023-12-15 ENCOUNTER — Ambulatory Visit
Admission: RE | Admit: 2023-12-15 | Discharge: 2023-12-15 | Disposition: A | Discharge status: Home / Self Care | Attending: Family Medicine | Admitting: Family Medicine

## 2023-12-15 VITALS — BP 118/72 | HR 86 | Ht 76.0 in | Wt 301.2 lb

## 2023-12-15 DIAGNOSIS — Z23 Encounter for immunization: Secondary | ICD-10-CM | POA: Diagnosis not present

## 2023-12-15 DIAGNOSIS — M5442 Lumbago with sciatica, left side: Secondary | ICD-10-CM | POA: Diagnosis not present

## 2023-12-15 DIAGNOSIS — G8929 Other chronic pain: Secondary | ICD-10-CM | POA: Insufficient documentation

## 2023-12-15 DIAGNOSIS — M5416 Radiculopathy, lumbar region: Secondary | ICD-10-CM | POA: Diagnosis not present

## 2023-12-15 DIAGNOSIS — M5115 Intervertebral disc disorders with radiculopathy, thoracolumbar region: Secondary | ICD-10-CM | POA: Diagnosis not present

## 2023-12-15 DIAGNOSIS — M545 Low back pain, unspecified: Secondary | ICD-10-CM | POA: Diagnosis not present

## 2023-12-15 MED ORDER — GABAPENTIN 300 MG PO CAPS: 300.0000 mg | ORAL_CAPSULE | Freq: Three times a day (TID) | ORAL | 3 refills | Status: AC | PRN

## 2023-12-15 MED ORDER — PREDNISONE 10 MG PO TABS: ORAL_TABLET | ORAL | 0 refills | Status: AC

## 2023-12-15 NOTE — Progress Notes (Signed)
 Acute visit   Patient: Jordan Donovan   DOB: 07-Jan-1981   43 y.o. Male  MRN: [id number] PCP: Myrla Jon HERO, MD   Chief Complaint  Patient presents with   Immunizations   Acute Visit   Back Pain    Patient reports back pain flared on 11/15/23 and he could not bend or anything at first but now the inflammation in upper disc has went down but the lower disc is inflamed and pushing on sciatic nerve. He reports he has been taking aleve and rx for gabapentin  300 mg taking once to twice a day.    Subjective    Discussed the use of AI scribe software for clinical note transcription with the patient, who gave verbal consent to proceed.  History of Present Illness   Jordan Donovan is a 43 year old male with chronic low back pain who presents with worsening radicular symptoms.  He has experienced chronic low back pain for nearly thirty years, with recent worsening. The pain radiates down his left leg to his toes, causing numbness. The exacerbation began after assembling a desk while sitting in a folding chair about a month ago.  The pain in the upper back has subsided, allowing him to bend over again, but the radicular pain persists. Aleve and gabapentin  have not provided significant relief.  His last imaging in 2008 showed L4-L5 disc degeneration without significant disc protrusion or spinal stenosis. He was advised that surgery was not necessary at that time. He has no metal implants and does not experience claustrophobia, which is relevant for potential future imaging studies.       Review of Systems  Objective    BP 118/72 (BP Location: Right Arm, Patient Position: Sitting, Cuff Size: Large)   Pulse 86   Ht 6' 4 (1.93 m)   Wt (!) 301 lb 3.2 oz (136.6 kg)   SpO2 100%   BMI 36.66 kg/m   Physical Exam Constitutional:      General: He is not in acute distress.    Appearance: Normal appearance. He is not diaphoretic.  HENT:     Head: Normocephalic.   Eyes:     Conjunctiva/sclera: Conjunctivae normal.  Pulmonary:     Effort: Pulmonary effort is normal. No respiratory distress.  Musculoskeletal:     Comments: Back: TTP over L4 and L5 spinous processes. Sensation changed in L foot. Strength intact b/l. Negative SLR  Neurological:     Mental Status: He is alert and oriented to person, place, and time. Mental status is at baseline.       No results found for any visits on 12/15/23.  Assessment & Plan     Problem List Items Addressed This Visit       Nervous and Auditory   Chronic bilateral low back pain with left-sided sciatica - Primary   Relevant Medications   predniSONE (DELTASONE) 10 MG tablet   gabapentin  (NEURONTIN ) 300 MG capsule   Other Relevant Orders   DG Lumbar Spine Complete   MR LUMBAR SPINE WO CONTRAST   Other Visit Diagnoses       Lumbar radiculopathy       Relevant Medications   gabapentin  (NEURONTIN ) 300 MG capsule   Other Relevant Orders   DG Lumbar Spine Complete   MR LUMBAR SPINE WO CONTRAST     Immunization due       Relevant Orders   Heplisav-B (HepB-CPG) Vaccine (Completed)  Lumbar radiculopathy with chronic low back pain Chronic low back pain with radiculopathy, likely due to degenerative disc disease at L4-L5 and L5-S1. Symptoms include pain radiating down the left leg to the toes, with numbness in the toes. Previous imaging from 2008 showed degeneration but no significant disc protrusion or spinal stenosis. Current symptoms suggest possible nerve compression. Gabapentin  and Aleve have been ineffective. Prednisone considered for anti-inflammatory effect, with potential referral to neurosurgery for further evaluation and possible intervention. - Ordered lumbar spine x-ray to facilitate MRI approval. - Prescribed prednisone with a 6-day taper: 6 pills on day 1, 5 on day 2, 4 on day 3, 3 on day 4, 2 on day 5, 1 on day 6. - Advised taking prednisone in the morning with food to protect  the stomach. - Instructed to avoid Aleve or ibuprofen  while on prednisone; Tylenol  and gabapentin  are permissible. - Ordered MRI pending x-ray results. - Will refer to neurosurgery for further evaluation and potential intervention. - Sent gabapentin  prescription to pharmacy.  Hepatitis B immunization, completed series Completed Hepatitis B vaccination series with no adverse reactions reported after the second dose.       Meds ordered this encounter  Medications   predniSONE (DELTASONE) 10 MG tablet    Sig: Take 60mg  PO daily x1d, then 50mg  daily x1d, then 40mg  daily x1d, then 30mg  daily x1d, then 20mg  daily x1d, then 10mg  daily x1d, then stop    Dispense:  21 tablet    Refill:  0   gabapentin  (NEURONTIN ) 300 MG capsule    Sig: Take 1 capsule (300 mg total) by mouth 3 (three) times daily as needed (pain).    Dispense:  90 capsule    Refill:  3     Return if symptoms worsen or fail to improve.      Jon Eva, MD  [facility] [phone] (phone) [phone] (fax)  [facility]

## 2023-12-16 ENCOUNTER — Ambulatory Visit: Payer: Self-pay | Admitting: Family Medicine

## 2023-12-16 DIAGNOSIS — M5136 Other intervertebral disc degeneration, lumbar region with discogenic back pain only: Secondary | ICD-10-CM

## 2023-12-17 NOTE — Telephone Encounter (Signed)
 Ok to send referral to neurosurgery with his prefenence in the comments - use diagnosis from my recent visit with him

## 2023-12-19 ENCOUNTER — Ambulatory Visit
Admission: RE | Admit: 2023-12-19 | Discharge: 2023-12-19 | Disposition: A | Discharge status: Home / Self Care | Source: Ambulatory Visit | Attending: Family Medicine | Admitting: Family Medicine

## 2023-12-19 DIAGNOSIS — G8929 Other chronic pain: Secondary | ICD-10-CM | POA: Insufficient documentation

## 2023-12-19 DIAGNOSIS — M5442 Lumbago with sciatica, left side: Secondary | ICD-10-CM | POA: Diagnosis not present

## 2023-12-19 DIAGNOSIS — M549 Dorsalgia, unspecified: Secondary | ICD-10-CM | POA: Diagnosis not present

## 2023-12-19 DIAGNOSIS — M5126 Other intervertebral disc displacement, lumbar region: Secondary | ICD-10-CM | POA: Diagnosis not present

## 2023-12-19 DIAGNOSIS — M5416 Radiculopathy, lumbar region: Secondary | ICD-10-CM | POA: Insufficient documentation

## 2023-12-19 DIAGNOSIS — M5136 Other intervertebral disc degeneration, lumbar region with discogenic back pain only: Secondary | ICD-10-CM | POA: Diagnosis not present

## 2024-01-05 DIAGNOSIS — M5416 Radiculopathy, lumbar region: Secondary | ICD-10-CM | POA: Diagnosis not present

## 2024-01-12 DIAGNOSIS — R293 Abnormal posture: Secondary | ICD-10-CM | POA: Diagnosis not present

## 2024-01-12 DIAGNOSIS — M5416 Radiculopathy, lumbar region: Secondary | ICD-10-CM | POA: Diagnosis not present

## 2024-01-19 DIAGNOSIS — R293 Abnormal posture: Secondary | ICD-10-CM | POA: Diagnosis not present

## 2024-01-19 DIAGNOSIS — M5416 Radiculopathy, lumbar region: Secondary | ICD-10-CM | POA: Diagnosis not present

## 2024-01-28 ENCOUNTER — Other Ambulatory Visit: Payer: Self-pay | Admitting: Family Medicine

## 2024-01-28 DIAGNOSIS — I10 Essential (primary) hypertension: Secondary | ICD-10-CM

## 2024-05-10 ENCOUNTER — Ambulatory Visit: Admitting: Family Medicine
